# Patient Record
Sex: Female | Born: 1935 | Race: White | Hispanic: No | Marital: Married | Smoking: Former smoker
Health system: Southern US, Community
[De-identification: ages and names within clinical notes are randomized; demographics above are authoritative.]

## PROBLEM LIST (undated history)

## (undated) DIAGNOSIS — R519 Headache, unspecified: Secondary | ICD-10-CM

## (undated) DIAGNOSIS — G709 Myoneural disorder, unspecified: Secondary | ICD-10-CM

## (undated) DIAGNOSIS — E785 Hyperlipidemia, unspecified: Secondary | ICD-10-CM

## (undated) DIAGNOSIS — E039 Hypothyroidism, unspecified: Secondary | ICD-10-CM

## (undated) DIAGNOSIS — N189 Chronic kidney disease, unspecified: Secondary | ICD-10-CM

## (undated) DIAGNOSIS — I10 Essential (primary) hypertension: Secondary | ICD-10-CM

## (undated) DIAGNOSIS — J449 Chronic obstructive pulmonary disease, unspecified: Secondary | ICD-10-CM

## (undated) DIAGNOSIS — R319 Hematuria, unspecified: Secondary | ICD-10-CM

## (undated) DIAGNOSIS — C801 Malignant (primary) neoplasm, unspecified: Secondary | ICD-10-CM

## (undated) DIAGNOSIS — I639 Cerebral infarction, unspecified: Secondary | ICD-10-CM

## (undated) DIAGNOSIS — R51 Headache: Secondary | ICD-10-CM

## (undated) HISTORY — PX: MASTECTOMY: SHX3

## (undated) HISTORY — PX: DILATION AND CURETTAGE OF UTERUS: SHX78

## (undated) HISTORY — PX: CHOLECYSTECTOMY: SHX55

## (undated) HISTORY — PX: ABDOMINAL HYSTERECTOMY: SHX81

## (undated) HISTORY — PX: BREAST SURGERY: SHX581

## (undated) HISTORY — PX: TONSILLECTOMY: SUR1361

## (undated) HISTORY — PX: CYSTOSCOPY: SUR368

## (undated) HISTORY — PX: APPENDECTOMY: SHX54

---

## 1999-08-26 ENCOUNTER — Encounter: Payer: Self-pay | Admitting: Nephrology

## 1999-08-26 ENCOUNTER — Ambulatory Visit (HOSPITAL_COMMUNITY): Admission: RE | Admit: 1999-08-26 | Discharge: 1999-08-26 | Payer: Self-pay | Admitting: Nephrology

## 2014-04-28 DIAGNOSIS — J209 Acute bronchitis, unspecified: Secondary | ICD-10-CM | POA: Diagnosis not present

## 2014-04-28 DIAGNOSIS — Z6824 Body mass index (BMI) 24.0-24.9, adult: Secondary | ICD-10-CM | POA: Diagnosis not present

## 2014-06-19 DIAGNOSIS — M1990 Unspecified osteoarthritis, unspecified site: Secondary | ICD-10-CM | POA: Diagnosis not present

## 2014-06-19 DIAGNOSIS — E785 Hyperlipidemia, unspecified: Secondary | ICD-10-CM | POA: Diagnosis not present

## 2014-06-19 DIAGNOSIS — J449 Chronic obstructive pulmonary disease, unspecified: Secondary | ICD-10-CM | POA: Diagnosis not present

## 2014-06-19 DIAGNOSIS — I10 Essential (primary) hypertension: Secondary | ICD-10-CM | POA: Diagnosis not present

## 2014-06-19 DIAGNOSIS — N189 Chronic kidney disease, unspecified: Secondary | ICD-10-CM | POA: Diagnosis not present

## 2014-06-19 DIAGNOSIS — D72829 Elevated white blood cell count, unspecified: Secondary | ICD-10-CM | POA: Diagnosis not present

## 2014-06-19 DIAGNOSIS — R5383 Other fatigue: Secondary | ICD-10-CM | POA: Diagnosis not present

## 2014-06-19 DIAGNOSIS — E039 Hypothyroidism, unspecified: Secondary | ICD-10-CM | POA: Diagnosis not present

## 2014-09-23 DIAGNOSIS — D72829 Elevated white blood cell count, unspecified: Secondary | ICD-10-CM | POA: Diagnosis not present

## 2014-09-23 DIAGNOSIS — I6990 Unspecified sequelae of unspecified cerebrovascular disease: Secondary | ICD-10-CM | POA: Diagnosis not present

## 2014-09-23 DIAGNOSIS — N189 Chronic kidney disease, unspecified: Secondary | ICD-10-CM | POA: Diagnosis not present

## 2014-09-23 DIAGNOSIS — I10 Essential (primary) hypertension: Secondary | ICD-10-CM | POA: Diagnosis not present

## 2014-09-23 DIAGNOSIS — Z9181 History of falling: Secondary | ICD-10-CM | POA: Diagnosis not present

## 2014-09-23 DIAGNOSIS — E785 Hyperlipidemia, unspecified: Secondary | ICD-10-CM | POA: Diagnosis not present

## 2014-09-23 DIAGNOSIS — Z139 Encounter for screening, unspecified: Secondary | ICD-10-CM | POA: Diagnosis not present

## 2014-09-23 DIAGNOSIS — Z1389 Encounter for screening for other disorder: Secondary | ICD-10-CM | POA: Diagnosis not present

## 2014-09-23 DIAGNOSIS — M79609 Pain in unspecified limb: Secondary | ICD-10-CM | POA: Diagnosis not present

## 2014-09-29 DIAGNOSIS — M79604 Pain in right leg: Secondary | ICD-10-CM | POA: Diagnosis not present

## 2014-09-29 DIAGNOSIS — Z6826 Body mass index (BMI) 26.0-26.9, adult: Secondary | ICD-10-CM | POA: Diagnosis not present

## 2014-10-01 DIAGNOSIS — M79604 Pain in right leg: Secondary | ICD-10-CM | POA: Diagnosis not present

## 2014-10-01 DIAGNOSIS — M19071 Primary osteoarthritis, right ankle and foot: Secondary | ICD-10-CM | POA: Diagnosis not present

## 2014-10-01 DIAGNOSIS — M79672 Pain in left foot: Secondary | ICD-10-CM | POA: Diagnosis not present

## 2014-10-07 DIAGNOSIS — Z6826 Body mass index (BMI) 26.0-26.9, adult: Secondary | ICD-10-CM | POA: Diagnosis not present

## 2014-10-07 DIAGNOSIS — M79609 Pain in unspecified limb: Secondary | ICD-10-CM | POA: Diagnosis not present

## 2014-10-07 DIAGNOSIS — M1990 Unspecified osteoarthritis, unspecified site: Secondary | ICD-10-CM | POA: Diagnosis not present

## 2014-11-07 DIAGNOSIS — H3531 Nonexudative age-related macular degeneration: Secondary | ICD-10-CM | POA: Diagnosis not present

## 2014-11-07 DIAGNOSIS — H5203 Hypermetropia, bilateral: Secondary | ICD-10-CM | POA: Diagnosis not present

## 2014-11-09 DIAGNOSIS — Z7902 Long term (current) use of antithrombotics/antiplatelets: Secondary | ICD-10-CM | POA: Diagnosis not present

## 2014-11-09 DIAGNOSIS — I639 Cerebral infarction, unspecified: Secondary | ICD-10-CM | POA: Diagnosis not present

## 2014-11-09 DIAGNOSIS — I6350 Cerebral infarction due to unspecified occlusion or stenosis of unspecified cerebral artery: Secondary | ICD-10-CM | POA: Diagnosis not present

## 2014-11-09 DIAGNOSIS — I672 Cerebral atherosclerosis: Secondary | ICD-10-CM | POA: Diagnosis not present

## 2014-11-09 DIAGNOSIS — N189 Chronic kidney disease, unspecified: Secondary | ICD-10-CM | POA: Diagnosis not present

## 2014-11-09 DIAGNOSIS — I671 Cerebral aneurysm, nonruptured: Secondary | ICD-10-CM | POA: Diagnosis not present

## 2014-11-09 DIAGNOSIS — I779 Disorder of arteries and arterioles, unspecified: Secondary | ICD-10-CM | POA: Diagnosis not present

## 2014-11-09 DIAGNOSIS — R079 Chest pain, unspecified: Secondary | ICD-10-CM | POA: Diagnosis not present

## 2014-11-09 DIAGNOSIS — M109 Gout, unspecified: Secondary | ICD-10-CM | POA: Diagnosis not present

## 2014-11-09 DIAGNOSIS — D631 Anemia in chronic kidney disease: Secondary | ICD-10-CM | POA: Diagnosis not present

## 2014-11-09 DIAGNOSIS — I129 Hypertensive chronic kidney disease with stage 1 through stage 4 chronic kidney disease, or unspecified chronic kidney disease: Secondary | ICD-10-CM | POA: Diagnosis not present

## 2014-11-09 DIAGNOSIS — R29810 Facial weakness: Secondary | ICD-10-CM | POA: Diagnosis not present

## 2014-11-09 DIAGNOSIS — I6523 Occlusion and stenosis of bilateral carotid arteries: Secondary | ICD-10-CM | POA: Diagnosis not present

## 2014-11-09 DIAGNOSIS — N183 Chronic kidney disease, stage 3 (moderate): Secondary | ICD-10-CM | POA: Diagnosis not present

## 2014-11-09 DIAGNOSIS — Z720 Tobacco use: Secondary | ICD-10-CM | POA: Diagnosis not present

## 2014-11-09 DIAGNOSIS — J439 Emphysema, unspecified: Secondary | ICD-10-CM | POA: Diagnosis not present

## 2014-11-09 DIAGNOSIS — J432 Centrilobular emphysema: Secondary | ICD-10-CM | POA: Diagnosis not present

## 2014-11-09 DIAGNOSIS — F329 Major depressive disorder, single episode, unspecified: Secondary | ICD-10-CM | POA: Diagnosis not present

## 2014-11-09 DIAGNOSIS — Z9049 Acquired absence of other specified parts of digestive tract: Secondary | ICD-10-CM | POA: Diagnosis not present

## 2014-11-09 DIAGNOSIS — M1990 Unspecified osteoarthritis, unspecified site: Secondary | ICD-10-CM | POA: Diagnosis not present

## 2014-11-09 DIAGNOSIS — D649 Anemia, unspecified: Secondary | ICD-10-CM | POA: Diagnosis not present

## 2014-11-09 DIAGNOSIS — Z8673 Personal history of transient ischemic attack (TIA), and cerebral infarction without residual deficits: Secondary | ICD-10-CM | POA: Diagnosis not present

## 2014-11-09 DIAGNOSIS — E039 Hypothyroidism, unspecified: Secondary | ICD-10-CM | POA: Diagnosis not present

## 2014-11-09 DIAGNOSIS — E780 Pure hypercholesterolemia: Secondary | ICD-10-CM | POA: Diagnosis not present

## 2014-11-09 DIAGNOSIS — I361 Nonrheumatic tricuspid (valve) insufficiency: Secondary | ICD-10-CM | POA: Diagnosis not present

## 2014-11-09 DIAGNOSIS — Z8249 Family history of ischemic heart disease and other diseases of the circulatory system: Secondary | ICD-10-CM | POA: Diagnosis not present

## 2014-11-13 ENCOUNTER — Telehealth (HOSPITAL_COMMUNITY): Payer: Self-pay | Admitting: Interventional Radiology

## 2014-11-13 NOTE — Telephone Encounter (Signed)
Have made multiple attempts to contact pt's granddaughter. She did not answer and has no VM set up [phone]). I called and spoke to the patient and she said she would have her granddaughter return my call. JM

## 2014-11-14 ENCOUNTER — Other Ambulatory Visit (HOSPITAL_COMMUNITY): Payer: Self-pay | Admitting: Interventional Radiology

## 2014-11-14 DIAGNOSIS — D649 Anemia, unspecified: Secondary | ICD-10-CM | POA: Diagnosis not present

## 2014-11-14 DIAGNOSIS — I671 Cerebral aneurysm, nonruptured: Secondary | ICD-10-CM | POA: Diagnosis not present

## 2014-11-14 DIAGNOSIS — Z720 Tobacco use: Secondary | ICD-10-CM | POA: Diagnosis not present

## 2014-11-14 DIAGNOSIS — I6350 Cerebral infarction due to unspecified occlusion or stenosis of unspecified cerebral artery: Secondary | ICD-10-CM | POA: Diagnosis not present

## 2014-11-14 DIAGNOSIS — I779 Disorder of arteries and arterioles, unspecified: Secondary | ICD-10-CM | POA: Diagnosis not present

## 2014-11-14 DIAGNOSIS — E780 Pure hypercholesterolemia: Secondary | ICD-10-CM | POA: Diagnosis not present

## 2014-11-16 DIAGNOSIS — I6350 Cerebral infarction due to unspecified occlusion or stenosis of unspecified cerebral artery: Secondary | ICD-10-CM | POA: Diagnosis not present

## 2014-11-16 DIAGNOSIS — E780 Pure hypercholesterolemia: Secondary | ICD-10-CM | POA: Diagnosis not present

## 2014-11-16 DIAGNOSIS — Z720 Tobacco use: Secondary | ICD-10-CM | POA: Diagnosis not present

## 2014-11-16 DIAGNOSIS — I779 Disorder of arteries and arterioles, unspecified: Secondary | ICD-10-CM | POA: Diagnosis not present

## 2014-11-16 DIAGNOSIS — I671 Cerebral aneurysm, nonruptured: Secondary | ICD-10-CM | POA: Diagnosis not present

## 2014-11-16 DIAGNOSIS — D649 Anemia, unspecified: Secondary | ICD-10-CM | POA: Diagnosis not present

## 2014-11-18 DIAGNOSIS — Z720 Tobacco use: Secondary | ICD-10-CM | POA: Diagnosis not present

## 2014-11-18 DIAGNOSIS — I6350 Cerebral infarction due to unspecified occlusion or stenosis of unspecified cerebral artery: Secondary | ICD-10-CM | POA: Diagnosis not present

## 2014-11-18 DIAGNOSIS — E780 Pure hypercholesterolemia: Secondary | ICD-10-CM | POA: Diagnosis not present

## 2014-11-18 DIAGNOSIS — I671 Cerebral aneurysm, nonruptured: Secondary | ICD-10-CM | POA: Diagnosis not present

## 2014-11-18 DIAGNOSIS — J449 Chronic obstructive pulmonary disease, unspecified: Secondary | ICD-10-CM | POA: Diagnosis not present

## 2014-11-18 DIAGNOSIS — D649 Anemia, unspecified: Secondary | ICD-10-CM | POA: Diagnosis not present

## 2014-11-18 DIAGNOSIS — Z6826 Body mass index (BMI) 26.0-26.9, adult: Secondary | ICD-10-CM | POA: Diagnosis not present

## 2014-11-18 DIAGNOSIS — I639 Cerebral infarction, unspecified: Secondary | ICD-10-CM | POA: Diagnosis not present

## 2014-11-18 DIAGNOSIS — I10 Essential (primary) hypertension: Secondary | ICD-10-CM | POA: Diagnosis not present

## 2014-11-18 DIAGNOSIS — I779 Disorder of arteries and arterioles, unspecified: Secondary | ICD-10-CM | POA: Diagnosis not present

## 2014-11-19 DIAGNOSIS — Z720 Tobacco use: Secondary | ICD-10-CM | POA: Diagnosis not present

## 2014-11-19 DIAGNOSIS — E780 Pure hypercholesterolemia: Secondary | ICD-10-CM | POA: Diagnosis not present

## 2014-11-19 DIAGNOSIS — I779 Disorder of arteries and arterioles, unspecified: Secondary | ICD-10-CM | POA: Diagnosis not present

## 2014-11-19 DIAGNOSIS — I671 Cerebral aneurysm, nonruptured: Secondary | ICD-10-CM | POA: Diagnosis not present

## 2014-11-19 DIAGNOSIS — I6350 Cerebral infarction due to unspecified occlusion or stenosis of unspecified cerebral artery: Secondary | ICD-10-CM | POA: Diagnosis not present

## 2014-11-19 DIAGNOSIS — D649 Anemia, unspecified: Secondary | ICD-10-CM | POA: Diagnosis not present

## 2014-11-20 ENCOUNTER — Ambulatory Visit (HOSPITAL_COMMUNITY)
Admission: RE | Admit: 2014-11-20 | Discharge: 2014-11-20 | Disposition: A | Discharge status: Home / Self Care | Payer: Medicare Other | Source: Ambulatory Visit | Attending: Interventional Radiology | Admitting: Interventional Radiology

## 2014-11-20 DIAGNOSIS — I671 Cerebral aneurysm, nonruptured: Secondary | ICD-10-CM

## 2014-11-21 DIAGNOSIS — E780 Pure hypercholesterolemia: Secondary | ICD-10-CM | POA: Diagnosis not present

## 2014-11-21 DIAGNOSIS — I6350 Cerebral infarction due to unspecified occlusion or stenosis of unspecified cerebral artery: Secondary | ICD-10-CM | POA: Diagnosis not present

## 2014-11-21 DIAGNOSIS — D649 Anemia, unspecified: Secondary | ICD-10-CM | POA: Diagnosis not present

## 2014-11-21 DIAGNOSIS — Z720 Tobacco use: Secondary | ICD-10-CM | POA: Diagnosis not present

## 2014-11-21 DIAGNOSIS — I671 Cerebral aneurysm, nonruptured: Secondary | ICD-10-CM | POA: Diagnosis not present

## 2014-11-21 DIAGNOSIS — I779 Disorder of arteries and arterioles, unspecified: Secondary | ICD-10-CM | POA: Diagnosis not present

## 2014-11-24 DIAGNOSIS — Z720 Tobacco use: Secondary | ICD-10-CM | POA: Diagnosis not present

## 2014-11-24 DIAGNOSIS — I671 Cerebral aneurysm, nonruptured: Secondary | ICD-10-CM | POA: Diagnosis not present

## 2014-11-24 DIAGNOSIS — I6350 Cerebral infarction due to unspecified occlusion or stenosis of unspecified cerebral artery: Secondary | ICD-10-CM | POA: Diagnosis not present

## 2014-11-24 DIAGNOSIS — D649 Anemia, unspecified: Secondary | ICD-10-CM | POA: Diagnosis not present

## 2014-11-24 DIAGNOSIS — E780 Pure hypercholesterolemia: Secondary | ICD-10-CM | POA: Diagnosis not present

## 2014-11-24 DIAGNOSIS — I779 Disorder of arteries and arterioles, unspecified: Secondary | ICD-10-CM | POA: Diagnosis not present

## 2014-11-25 DIAGNOSIS — I6350 Cerebral infarction due to unspecified occlusion or stenosis of unspecified cerebral artery: Secondary | ICD-10-CM | POA: Diagnosis not present

## 2014-11-25 DIAGNOSIS — I671 Cerebral aneurysm, nonruptured: Secondary | ICD-10-CM | POA: Diagnosis not present

## 2014-11-25 DIAGNOSIS — D649 Anemia, unspecified: Secondary | ICD-10-CM | POA: Diagnosis not present

## 2014-11-25 DIAGNOSIS — E780 Pure hypercholesterolemia: Secondary | ICD-10-CM | POA: Diagnosis not present

## 2014-11-25 DIAGNOSIS — Z720 Tobacco use: Secondary | ICD-10-CM | POA: Diagnosis not present

## 2014-11-25 DIAGNOSIS — I779 Disorder of arteries and arterioles, unspecified: Secondary | ICD-10-CM | POA: Diagnosis not present

## 2014-11-27 ENCOUNTER — Telehealth (HOSPITAL_COMMUNITY): Payer: Self-pay

## 2014-11-27 DIAGNOSIS — I6350 Cerebral infarction due to unspecified occlusion or stenosis of unspecified cerebral artery: Secondary | ICD-10-CM | POA: Diagnosis not present

## 2014-11-27 DIAGNOSIS — I671 Cerebral aneurysm, nonruptured: Secondary | ICD-10-CM | POA: Diagnosis not present

## 2014-11-27 DIAGNOSIS — I779 Disorder of arteries and arterioles, unspecified: Secondary | ICD-10-CM | POA: Diagnosis not present

## 2014-11-27 DIAGNOSIS — E780 Pure hypercholesterolemia: Secondary | ICD-10-CM | POA: Diagnosis not present

## 2014-11-27 DIAGNOSIS — Z720 Tobacco use: Secondary | ICD-10-CM | POA: Diagnosis not present

## 2014-11-27 DIAGNOSIS — D649 Anemia, unspecified: Secondary | ICD-10-CM | POA: Diagnosis not present

## 2014-11-28 ENCOUNTER — Telehealth (HOSPITAL_COMMUNITY): Payer: Self-pay | Admitting: Interventional Radiology

## 2014-11-28 DIAGNOSIS — I6350 Cerebral infarction due to unspecified occlusion or stenosis of unspecified cerebral artery: Secondary | ICD-10-CM | POA: Diagnosis not present

## 2014-11-28 DIAGNOSIS — I671 Cerebral aneurysm, nonruptured: Secondary | ICD-10-CM | POA: Diagnosis not present

## 2014-11-28 DIAGNOSIS — I779 Disorder of arteries and arterioles, unspecified: Secondary | ICD-10-CM | POA: Diagnosis not present

## 2014-11-28 DIAGNOSIS — E780 Pure hypercholesterolemia: Secondary | ICD-10-CM | POA: Diagnosis not present

## 2014-11-28 DIAGNOSIS — Z720 Tobacco use: Secondary | ICD-10-CM | POA: Diagnosis not present

## 2014-11-28 DIAGNOSIS — D649 Anemia, unspecified: Secondary | ICD-10-CM | POA: Diagnosis not present

## 2014-11-28 NOTE — Telephone Encounter (Signed)
Have tried to call the patient's granddaughter multiple times. She has no VM set up so unable to leave a message. I called the patient today and asked her to have her granddaughter call me. She stated that she would call her and have her call me. JM

## 2014-12-02 DIAGNOSIS — I10 Essential (primary) hypertension: Secondary | ICD-10-CM | POA: Diagnosis not present

## 2014-12-02 DIAGNOSIS — E780 Pure hypercholesterolemia: Secondary | ICD-10-CM | POA: Diagnosis not present

## 2014-12-02 DIAGNOSIS — Z01810 Encounter for preprocedural cardiovascular examination: Secondary | ICD-10-CM | POA: Diagnosis not present

## 2014-12-03 DIAGNOSIS — I779 Disorder of arteries and arterioles, unspecified: Secondary | ICD-10-CM | POA: Diagnosis not present

## 2014-12-03 DIAGNOSIS — Z720 Tobacco use: Secondary | ICD-10-CM | POA: Diagnosis not present

## 2014-12-03 DIAGNOSIS — E780 Pure hypercholesterolemia: Secondary | ICD-10-CM | POA: Diagnosis not present

## 2014-12-03 DIAGNOSIS — I6350 Cerebral infarction due to unspecified occlusion or stenosis of unspecified cerebral artery: Secondary | ICD-10-CM | POA: Diagnosis not present

## 2014-12-03 DIAGNOSIS — D649 Anemia, unspecified: Secondary | ICD-10-CM | POA: Diagnosis not present

## 2014-12-03 DIAGNOSIS — I671 Cerebral aneurysm, nonruptured: Secondary | ICD-10-CM | POA: Diagnosis not present

## 2014-12-04 DIAGNOSIS — Z720 Tobacco use: Secondary | ICD-10-CM | POA: Diagnosis not present

## 2014-12-04 DIAGNOSIS — I6350 Cerebral infarction due to unspecified occlusion or stenosis of unspecified cerebral artery: Secondary | ICD-10-CM | POA: Diagnosis not present

## 2014-12-04 DIAGNOSIS — I671 Cerebral aneurysm, nonruptured: Secondary | ICD-10-CM | POA: Diagnosis not present

## 2014-12-04 DIAGNOSIS — D649 Anemia, unspecified: Secondary | ICD-10-CM | POA: Diagnosis not present

## 2014-12-04 DIAGNOSIS — I779 Disorder of arteries and arterioles, unspecified: Secondary | ICD-10-CM | POA: Diagnosis not present

## 2014-12-04 DIAGNOSIS — E780 Pure hypercholesterolemia: Secondary | ICD-10-CM | POA: Diagnosis not present

## 2014-12-05 DIAGNOSIS — Z01810 Encounter for preprocedural cardiovascular examination: Secondary | ICD-10-CM | POA: Diagnosis not present

## 2014-12-08 DIAGNOSIS — I6350 Cerebral infarction due to unspecified occlusion or stenosis of unspecified cerebral artery: Secondary | ICD-10-CM | POA: Diagnosis not present

## 2014-12-08 DIAGNOSIS — D649 Anemia, unspecified: Secondary | ICD-10-CM | POA: Diagnosis not present

## 2014-12-08 DIAGNOSIS — I671 Cerebral aneurysm, nonruptured: Secondary | ICD-10-CM | POA: Diagnosis not present

## 2014-12-08 DIAGNOSIS — I779 Disorder of arteries and arterioles, unspecified: Secondary | ICD-10-CM | POA: Diagnosis not present

## 2014-12-08 DIAGNOSIS — E780 Pure hypercholesterolemia: Secondary | ICD-10-CM | POA: Diagnosis not present

## 2014-12-08 DIAGNOSIS — Z720 Tobacco use: Secondary | ICD-10-CM | POA: Diagnosis not present

## 2014-12-09 DIAGNOSIS — R9439 Abnormal result of other cardiovascular function study: Secondary | ICD-10-CM | POA: Diagnosis not present

## 2014-12-09 DIAGNOSIS — Z01810 Encounter for preprocedural cardiovascular examination: Secondary | ICD-10-CM | POA: Diagnosis not present

## 2014-12-12 DIAGNOSIS — Z720 Tobacco use: Secondary | ICD-10-CM | POA: Diagnosis not present

## 2014-12-12 DIAGNOSIS — I6350 Cerebral infarction due to unspecified occlusion or stenosis of unspecified cerebral artery: Secondary | ICD-10-CM | POA: Diagnosis not present

## 2014-12-12 DIAGNOSIS — D649 Anemia, unspecified: Secondary | ICD-10-CM | POA: Diagnosis not present

## 2014-12-12 DIAGNOSIS — I671 Cerebral aneurysm, nonruptured: Secondary | ICD-10-CM | POA: Diagnosis not present

## 2014-12-12 DIAGNOSIS — I779 Disorder of arteries and arterioles, unspecified: Secondary | ICD-10-CM | POA: Diagnosis not present

## 2014-12-12 DIAGNOSIS — E780 Pure hypercholesterolemia: Secondary | ICD-10-CM | POA: Diagnosis not present

## 2014-12-16 ENCOUNTER — Telehealth (HOSPITAL_COMMUNITY): Payer: Self-pay

## 2014-12-16 DIAGNOSIS — I639 Cerebral infarction, unspecified: Secondary | ICD-10-CM | POA: Diagnosis not present

## 2014-12-16 DIAGNOSIS — N189 Chronic kidney disease, unspecified: Secondary | ICD-10-CM | POA: Diagnosis not present

## 2014-12-16 DIAGNOSIS — E039 Hypothyroidism, unspecified: Secondary | ICD-10-CM | POA: Diagnosis not present

## 2014-12-16 DIAGNOSIS — I671 Cerebral aneurysm, nonruptured: Secondary | ICD-10-CM | POA: Diagnosis not present

## 2014-12-16 DIAGNOSIS — Z6826 Body mass index (BMI) 26.0-26.9, adult: Secondary | ICD-10-CM | POA: Diagnosis not present

## 2014-12-16 DIAGNOSIS — Z720 Tobacco use: Secondary | ICD-10-CM | POA: Diagnosis not present

## 2014-12-16 NOTE — Telephone Encounter (Signed)
Called daughter Jackalyn Lombard to let her know that we were waiting on insurance info to get back before we could schedule procedure. Daughter was ok with this plan and I told her that I would let her know as soon as I get pre-cert back. AW

## 2014-12-17 ENCOUNTER — Other Ambulatory Visit (HOSPITAL_COMMUNITY): Payer: Self-pay | Admitting: Interventional Radiology

## 2014-12-17 DIAGNOSIS — I779 Disorder of arteries and arterioles, unspecified: Secondary | ICD-10-CM | POA: Diagnosis not present

## 2014-12-17 DIAGNOSIS — E780 Pure hypercholesterolemia: Secondary | ICD-10-CM | POA: Diagnosis not present

## 2014-12-17 DIAGNOSIS — I729 Aneurysm of unspecified site: Secondary | ICD-10-CM

## 2014-12-17 DIAGNOSIS — I6350 Cerebral infarction due to unspecified occlusion or stenosis of unspecified cerebral artery: Secondary | ICD-10-CM | POA: Diagnosis not present

## 2014-12-17 DIAGNOSIS — I671 Cerebral aneurysm, nonruptured: Secondary | ICD-10-CM | POA: Diagnosis not present

## 2014-12-17 DIAGNOSIS — D649 Anemia, unspecified: Secondary | ICD-10-CM | POA: Diagnosis not present

## 2014-12-17 DIAGNOSIS — Z720 Tobacco use: Secondary | ICD-10-CM | POA: Diagnosis not present

## 2014-12-18 ENCOUNTER — Other Ambulatory Visit: Payer: Self-pay | Admitting: Radiology

## 2014-12-30 ENCOUNTER — Other Ambulatory Visit: Payer: Self-pay | Admitting: Radiology

## 2014-12-30 ENCOUNTER — Encounter (HOSPITAL_COMMUNITY): Payer: Self-pay | Admitting: Vascular Surgery

## 2014-12-30 ENCOUNTER — Encounter (HOSPITAL_COMMUNITY): Payer: Self-pay

## 2014-12-30 ENCOUNTER — Encounter (HOSPITAL_COMMUNITY)
Admission: RE | Admit: 2014-12-30 | Discharge: 2014-12-30 | Disposition: A | Discharge status: Home / Self Care | Payer: Medicare Other | Source: Ambulatory Visit | Attending: Interventional Radiology | Admitting: Interventional Radiology

## 2014-12-30 DIAGNOSIS — I671 Cerebral aneurysm, nonruptured: Secondary | ICD-10-CM | POA: Insufficient documentation

## 2014-12-30 DIAGNOSIS — Z01818 Encounter for other preprocedural examination: Secondary | ICD-10-CM | POA: Diagnosis not present

## 2014-12-30 DIAGNOSIS — Z8673 Personal history of transient ischemic attack (TIA), and cerebral infarction without residual deficits: Secondary | ICD-10-CM | POA: Diagnosis not present

## 2014-12-30 DIAGNOSIS — E785 Hyperlipidemia, unspecified: Secondary | ICD-10-CM | POA: Diagnosis not present

## 2014-12-30 DIAGNOSIS — Z853 Personal history of malignant neoplasm of breast: Secondary | ICD-10-CM | POA: Diagnosis not present

## 2014-12-30 DIAGNOSIS — I129 Hypertensive chronic kidney disease with stage 1 through stage 4 chronic kidney disease, or unspecified chronic kidney disease: Secondary | ICD-10-CM | POA: Insufficient documentation

## 2014-12-30 DIAGNOSIS — E039 Hypothyroidism, unspecified: Secondary | ICD-10-CM | POA: Insufficient documentation

## 2014-12-30 DIAGNOSIS — Z79899 Other long term (current) drug therapy: Secondary | ICD-10-CM | POA: Insufficient documentation

## 2014-12-30 DIAGNOSIS — Z87891 Personal history of nicotine dependence: Secondary | ICD-10-CM | POA: Insufficient documentation

## 2014-12-30 DIAGNOSIS — N189 Chronic kidney disease, unspecified: Secondary | ICD-10-CM | POA: Diagnosis not present

## 2014-12-30 DIAGNOSIS — Z01812 Encounter for preprocedural laboratory examination: Secondary | ICD-10-CM | POA: Diagnosis not present

## 2014-12-30 DIAGNOSIS — Z7982 Long term (current) use of aspirin: Secondary | ICD-10-CM | POA: Diagnosis not present

## 2014-12-30 HISTORY — DX: Chronic kidney disease, unspecified: N18.9

## 2014-12-30 HISTORY — DX: Headache: R51

## 2014-12-30 HISTORY — DX: Myoneural disorder, unspecified: G70.9

## 2014-12-30 HISTORY — DX: Chronic obstructive pulmonary disease, unspecified: J44.9

## 2014-12-30 HISTORY — DX: Hyperlipidemia, unspecified: E78.5

## 2014-12-30 HISTORY — DX: Essential (primary) hypertension: I10

## 2014-12-30 HISTORY — DX: Malignant (primary) neoplasm, unspecified: C80.1

## 2014-12-30 HISTORY — DX: Hematuria, unspecified: R31.9

## 2014-12-30 HISTORY — DX: Headache, unspecified: R51.9

## 2014-12-30 HISTORY — DX: Cerebral infarction, unspecified: I63.9

## 2014-12-30 HISTORY — DX: Hypothyroidism, unspecified: E03.9

## 2014-12-30 LAB — APTT: APTT: 28 s (ref 24–37)

## 2014-12-30 LAB — COMPREHENSIVE METABOLIC PANEL
ALBUMIN: 3.3 g/dL — AB (ref 3.5–5.0)
ALT: 14 U/L (ref 14–54)
ANION GAP: 6 (ref 5–15)
AST: 20 U/L (ref 15–41)
Alkaline Phosphatase: 93 U/L (ref 38–126)
BILIRUBIN TOTAL: 0.4 mg/dL (ref 0.3–1.2)
BUN: 43 mg/dL — AB (ref 6–20)
CHLORIDE: 107 mmol/L (ref 101–111)
CO2: 27 mmol/L (ref 22–32)
Calcium: 9.1 mg/dL (ref 8.9–10.3)
Creatinine, Ser: 2.73 mg/dL — ABNORMAL HIGH (ref 0.44–1.00)
GFR calc Af Amer: 17 mL/min — ABNORMAL LOW (ref >60)
GFR calc non Af Amer: 15 mL/min — ABNORMAL LOW (ref >60)
GLUCOSE: 97 mg/dL (ref 65–99)
POTASSIUM: 4.9 mmol/L (ref 3.5–5.1)
SODIUM: 140 mmol/L (ref 135–145)
TOTAL PROTEIN: 6.9 g/dL (ref 6.5–8.1)

## 2014-12-30 LAB — CBC WITH DIFFERENTIAL/PLATELET
BASOS ABS: 0 10*3/uL (ref 0.0–0.1)
BASOS PCT: 0 % (ref 0–1)
EOS ABS: 0.3 10*3/uL (ref 0.0–0.7)
EOS PCT: 3 % (ref 0–5)
HEMATOCRIT: 33.5 % — AB (ref 36.0–46.0)
Hemoglobin: 10.5 g/dL — ABNORMAL LOW (ref 12.0–15.0)
Lymphocytes Relative: 22 % (ref 12–46)
Lymphs Abs: 2.4 10*3/uL (ref 0.7–4.0)
MCH: 30.7 pg (ref 26.0–34.0)
MCHC: 31.3 g/dL (ref 30.0–36.0)
MCV: 98 fL (ref 78.0–100.0)
MONO ABS: 0.6 10*3/uL (ref 0.1–1.0)
MONOS PCT: 6 % (ref 3–12)
NEUTROS ABS: 7.6 10*3/uL (ref 1.7–7.7)
Neutrophils Relative %: 69 % (ref 43–77)
PLATELETS: 210 10*3/uL (ref 150–400)
RBC: 3.42 MIL/uL — ABNORMAL LOW (ref 3.87–5.11)
RDW: 16.3 % — AB (ref 11.5–15.5)
WBC: 11 10*3/uL — ABNORMAL HIGH (ref 4.0–10.5)

## 2014-12-30 LAB — PROTIME-INR
INR: 1.07 (ref 0.00–1.49)
Prothrombin Time: 14.1 seconds (ref 11.6–15.2)

## 2014-12-30 NOTE — Pre-Procedure Instructions (Addendum)
Danielle Erickson  12/30/2014      [facility] - [place], [place] [address] Phone: [phone] Fax: [phone]    Your procedure is scheduled on Thursday September 8th 2016 @ 830.  Report to [facility] Admitting at 630 A.M.  Call this number if you have problems the morning of surgery:  [phone]   Call this number if you have problems in the days leading up to your surgery:  [phone]    Remember:  Do not eat food or drink liquids after midnight Thursday September 7th 2016.  Take these medicines the morning of surgery with A SIP OF WATER allopurinol (zyloprim), amlodipine (norvasc), Clonidine (Catapres),nite, Clonazepam (Klonopin), metoprolol (Toprol-XL), Paroxetine (Paxil),nite, levothyroxine (synthroid, levothroid) IF needed: Hydrocodone-acetaminophen (Vicodin)   Take medicines as you normally do, but these are the ones you can take in the morning if you normally take them then.  If you normally take your metoprolol in the morning, please be sure to take that then.     STOP: ALL Vitamins, Supplements, Effient and Herbal Medications, Fish Oils, Aspirins, NSAIDs (Nonsteroidal Anti-inflammatories such as  Ibuprofen, Aleve, or Advil), and Goody's/BC Powders 7 days prior to surgery, until after surgery as directed by your physician. This includes: Folic Acid/Vit X9/BZJ I96  Take aspirin 325 mg including dos STOP aggranox 12/30/14 per p turpin pa/ dr deveshwar( added 12/31/14 after talking to patient's dtr by Onelia Cadmus)   Do not wear jewelry, make-up or nail polish.  Do not wear lotions, powders, or perfumes.  You may wear deodorant.  Do not shave 48 hours prior to surgery.  Men may shave face and neck.  Do not bring valuables to the hospital.  [facility] is not responsible for any belongings or valuables.  Contacts, dentures or bridgework may not be worn into surgery.  Leave your suitcase in the car.  After  surgery it may be brought to your room.  For patients admitted to the hospital, discharge time will be determined by your treatment team.  Patients discharged the day of surgery will not be allowed to drive home.   Special instructions: Please follow these instructions carefully:  1. Shower with CHG Soap the night before surgery and the morning of Surgery. 2. If you choose to wash your hair, wash your hair first as usual with your normal shampoo. 3. After you shampoo, rinse your hair and body thoroughly to remove the Shampoo. 4. Use CHG as you would any other liquid soap. You can apply chg directly to the skin and wash gently with scrungie or a clean washcloth. 5. Apply the CHG Soap to your body ONLY FROM THE NECK DOWN. Do not use on open wounds or open sores. Avoid contact with your eyes, ears, mouth and genitals (private parts). Wash genitals (private parts) with your normal soap. 6. Wash thoroughly, paying special attention to the area where your surgery will be performed. 7. Thoroughly rinse your body with warm water from the neck down. 8. DO NOT shower/wash with your normal soap after using and rinsing off the CHG Soap. 9. Pat yourself dry with a clean towel.  10. Wear clean pajamas.  11. Place clean sheets on your bed the night of your first shower and do not sleep with pets.  Day of Surgery  Do not apply any lotions/deodorants the morning of surgery. Please wear clean clothes to the hospital/surgery center.    Please  read over the following fact sheets that you were given. Pain Booklet, Coughing and Deep Breathing and Surgical Site Infection Prevention

## 2014-12-30 NOTE — Progress Notes (Signed)
Checked with Jannifer Franklin, PA and originally told to tell patient to continue taking Aggrenox as normal and also morning of surgery.  Pam called back after patient had left and stated that MD wants pt to stop the Aggrenox and start a 325 mg aspirin daily to take through the DOS.  Called patient's son and verbalized instructions; he verbalized understanding.

## 2014-12-30 NOTE — Progress Notes (Addendum)
Anesthesia Chart Review: Patient is a 81 year old female scheduled for cerebral arteriogram with possible embolization of left MCA aneurysm under anesthesia on 01/01/15 by Dr. Estanislado Pandy. She had an episode of right sided extremity weakness in 12/2013 which recovered almost completely then ~ 10/2014 she had a similar episode, however this time involving primarily the right side of her face and mild expressive difficulties. MRI/MRA was done for CVA work-up and revealed a 10.3 mm aneurysm in the left MCA region. There was also suggestion of possible proximal right middle cerebral artery stenosis suggested by the decrease in caliber of the left MCA. However, similar caliber is also seen in the right MCA distribution at the level of the bifurcation. She was referred to IR for intervention. Dr. Estanislado Pandy felt her risk for rupture was 1-2% per year with attendant significant mortality and morbidity.   History includes recent former smoker (quit 7/1/6), HLD, HTN, CVA 12/2013 and 10/2014, hypothyroidism, CKD, neck tremor, breast cancer s/p right mastectomy ~ 20 years ago, hysterectomy, appendectomy, cholecystectomy. PCP is Laverna Peace, [facility] with [employer] in [place].   PAT Vitals: BP 165/60, RR 18, HR 70, 94% RA, 36.6C.  Meds include allopurinol, amlodipine, clonazepam, clonidine, Aggrenox, Lasix, Norco, levothyroxine, Toprol XL, Paxil. Crestor. Per PA Pam with IR, patient to stop Aggrenox and start 325 mg ASA daily and take on the day of surgery.   Patient was sent to [facility] [place] [facility] for a preoperative cardiology evaluation. She was seen by Neldon Labella, NP and ultimately cleared from a cardiac standpoint only following a stress and echo (see below). Testing revealed a small LV, so she recommended increased hydration pre and peri-operatively.  12/09/14 Echo [facility]): LV cavity is small. Mild concentric hypertrophy of the LV. Normal global wall motion. Doppler evidence of grade I (impaired) diastolic  dysfunction. Calculated EF 56%. Mild AV leaflet thickening, otherwise no siginficant valvular abnormalities.  12/05/14 Nuclear stress test [facility]):  1. ECG demonstrated NSR, normal resting conduction, no resting arrhythmias and normal rest repolarization. Stress EKG is non-diagnostic for ischemia as it a pharmacologic stress using Lesxiscan. Stress symptoms of dyspnea and dizziness.  2. Myocardial perfusion imaging is normal without ischemia or scar. LVEF was calculated at 17%, but probably an error due to small sized ventricle (45 mL). Overall low risk. Clinical correlation recommended.   12/02/14 EKG: NSR. T wave inversion in septal leads. Baseline wanderer, particularly in inferior leads.   Pre-procedure labs noted. Cr 2.73. Currently, I don't have any comparison labs but Dr. Arlean Hopping 11/20/14 consult note mentions known "mild renal insufficiency with a creatinine of 1.9..."  Records from [facility] requested, but are still pending. Patient denied having a neurologist. I updated anesthesiologist Dr. Glennon Mac regarding recent CVA history and increased creatinine. She agrees that renal issues will need to be addressed prior to surgery. I left a voice message with Anderson Malta at Dr. Arlean Hopping office and also notified PA Pam.  Chart will be left for follow-up of additional records.  George Hugh [facility] Short Stay Center/Anesthesiology Phone [phone] 12/30/2014 5:02 PM  Addendum: Records received from [facility]. She was hospitalized from 11/09/14-11/11/14 for non-hemorrhagic CVA. Attending was internist Dr. Delma Officer. She was not a candidate for tPA due to delayed presentation.   CKD was listed as stage III with Cr in 12/2013 of 1.8 and up to 2.3 on 11/11/14 (high of 2.5 during hospitalization).   She also had an echo on 11/09/14 that showed LVEF 60-65% with normal LV  size, impaired relaxation, mild TR.  11/10/14 Carotid duplex: No hemodynamically significant stenosis  or plaque is noted in either ICA's.   11/10/14 MRA Head/MRI Brain:  MRA: 8X10 mm aneurysm of the left MCA at the bifurcation/trifurcation region projecting anteriorly. Near occlusion in the left M1 segment, possibly due to embolus. Serial stenoses of the right M1, M2, and M3 segments.  MRI: Acute non-hemorrhagic infarct posterior left lenticular nucleus extending into the mid to posterior left corona radiata. Remote posterior left frontal-parietal lobe infarct with encephalomalacia. Remote mid left corona radiate infarct. Remote tiny cerebellar infarcts. Moderate small vessel disease type changes. 1cm left MCA bifurcation aneurysm.   11/09/14 1V CXR: Mild cardiac enlargement. No pleural effusion or edema. No airspace consolidation. The visualized osseous structures appear intact. Impression: No active disease.  George Hugh [facility] Short Stay Center/Anesthesiology Phone [phone] 12/30/2014 5:40 PM

## 2014-12-31 ENCOUNTER — Other Ambulatory Visit: Payer: Self-pay | Admitting: Radiology

## 2014-12-31 ENCOUNTER — Telehealth (HOSPITAL_COMMUNITY): Payer: Self-pay | Admitting: *Deleted

## 2014-12-31 NOTE — Telephone Encounter (Signed)
Called Danielle Erickson with lab work.  Explained to patient that due to her elevated BUN and CR Dr. Estanislado Pandy needed to cancel her procedure for 01/01/15.  Instructed patient that Dr. Estanislado Pandy wanted her to increase her water intake for next 3-4 days and that she need to come back in on Monday and have her labs redrawn.  Pt also instructed to remain off aggrenox and continue ASA 325mg  daily as instructed by Dr. Estanislado Pandy.  PT verbalized understanding

## 2015-01-01 ENCOUNTER — Ambulatory Visit (HOSPITAL_COMMUNITY)
Admission: RE | Admit: 2015-01-01 | Payer: Medicare Other | Source: Ambulatory Visit | Admitting: Interventional Radiology

## 2015-01-01 ENCOUNTER — Encounter (HOSPITAL_COMMUNITY): Admission: RE | Payer: Self-pay | Source: Ambulatory Visit

## 2015-01-01 ENCOUNTER — Inpatient Hospital Stay (HOSPITAL_COMMUNITY): Admission: RE | Admit: 2015-01-01 | Payer: Self-pay | Source: Ambulatory Visit

## 2015-01-01 SURGERY — RADIOLOGY WITH ANESTHESIA
Anesthesia: General

## 2015-01-05 ENCOUNTER — Other Ambulatory Visit: Payer: Self-pay | Admitting: Radiology

## 2015-01-05 ENCOUNTER — Telehealth (HOSPITAL_COMMUNITY): Payer: Self-pay | Admitting: Interventional Radiology

## 2015-01-05 LAB — BASIC METABOLIC PANEL
Anion gap: 10 (ref 5–15)
BUN: 40 mg/dL — AB (ref 6–20)
CHLORIDE: 105 mmol/L (ref 101–111)
CO2: 24 mmol/L (ref 22–32)
CREATININE: 2.59 mg/dL — AB (ref 0.44–1.00)
Calcium: 8.6 mg/dL — ABNORMAL LOW (ref 8.9–10.3)
GFR calc Af Amer: 19 mL/min — ABNORMAL LOW (ref >60)
GFR calc non Af Amer: 16 mL/min — ABNORMAL LOW (ref >60)
GLUCOSE: 100 mg/dL — AB (ref 65–99)
POTASSIUM: 4 mmol/L (ref 3.5–5.1)
SODIUM: 139 mmol/L (ref 135–145)

## 2015-01-05 NOTE — Telephone Encounter (Signed)
Pt and granddaughter Edwyna Ready) came in for pt to have a repeat B-MET study. After results were reviewed by Dr. Estanislado Pandy I called the patient's granddaughter back to let her know that per Deveshwar pt needs to continue hydration and have another recheck B-MET in 1 week's time. Ms. Luana Shu states understanding and is in agreement w/ this plan of care. JM

## 2015-01-12 ENCOUNTER — Other Ambulatory Visit: Payer: Self-pay | Admitting: Radiology

## 2015-01-12 ENCOUNTER — Other Ambulatory Visit: Payer: Self-pay | Admitting: Interventional Radiology

## 2015-01-12 DIAGNOSIS — I729 Aneurysm of unspecified site: Secondary | ICD-10-CM

## 2015-01-12 LAB — BASIC METABOLIC PANEL
Anion gap: 10 (ref 5–15)
BUN: 47 mg/dL — ABNORMAL HIGH (ref 6–20)
CALCIUM: 9.1 mg/dL (ref 8.9–10.3)
CHLORIDE: 108 mmol/L (ref 101–111)
CO2: 27 mmol/L (ref 22–32)
CREATININE: 2.79 mg/dL — AB (ref 0.44–1.00)
GFR calc Af Amer: 17 mL/min — ABNORMAL LOW (ref >60)
GFR calc non Af Amer: 15 mL/min — ABNORMAL LOW (ref >60)
GLUCOSE: 113 mg/dL — AB (ref 65–99)
Potassium: 4.8 mmol/L (ref 3.5–5.1)
Sodium: 145 mmol/L (ref 135–145)

## 2015-01-13 ENCOUNTER — Telehealth (HOSPITAL_COMMUNITY): Payer: Self-pay | Admitting: Interventional Radiology

## 2015-01-13 NOTE — Telephone Encounter (Signed)
Called and spoke to pt's granddaughter Danielle Erickson). Told her that Dr. Estanislado Pandy wants the patient to see a nephrologist due to her continued rising BUN and creatinine. Pt's granddaughter is going to call Dr. Elissa Hefty office to schedule a new patient appointment. She will call me back when this is arranged. JM

## 2015-01-27 DIAGNOSIS — J449 Chronic obstructive pulmonary disease, unspecified: Secondary | ICD-10-CM | POA: Diagnosis not present

## 2015-01-27 DIAGNOSIS — I639 Cerebral infarction, unspecified: Secondary | ICD-10-CM | POA: Diagnosis not present

## 2015-01-27 DIAGNOSIS — Z1389 Encounter for screening for other disorder: Secondary | ICD-10-CM | POA: Diagnosis not present

## 2015-01-27 DIAGNOSIS — E785 Hyperlipidemia, unspecified: Secondary | ICD-10-CM | POA: Diagnosis not present

## 2015-01-27 DIAGNOSIS — N189 Chronic kidney disease, unspecified: Secondary | ICD-10-CM | POA: Diagnosis not present

## 2015-01-27 DIAGNOSIS — Z6826 Body mass index (BMI) 26.0-26.9, adult: Secondary | ICD-10-CM | POA: Diagnosis not present

## 2015-01-27 DIAGNOSIS — Z23 Encounter for immunization: Secondary | ICD-10-CM | POA: Diagnosis not present

## 2015-02-04 DIAGNOSIS — S52502A Unspecified fracture of the lower end of left radius, initial encounter for closed fracture: Secondary | ICD-10-CM | POA: Diagnosis not present

## 2015-02-04 DIAGNOSIS — S52602A Unspecified fracture of lower end of left ulna, initial encounter for closed fracture: Secondary | ICD-10-CM | POA: Diagnosis not present

## 2015-02-04 DIAGNOSIS — Z9889 Other specified postprocedural states: Secondary | ICD-10-CM | POA: Diagnosis not present

## 2015-02-04 DIAGNOSIS — S52002A Unspecified fracture of upper end of left ulna, initial encounter for closed fracture: Secondary | ICD-10-CM | POA: Diagnosis not present

## 2015-02-04 DIAGNOSIS — S52102A Unspecified fracture of upper end of left radius, initial encounter for closed fracture: Secondary | ICD-10-CM | POA: Diagnosis not present

## 2015-02-06 DIAGNOSIS — S52002D Unspecified fracture of upper end of left ulna, subsequent encounter for closed fracture with routine healing: Secondary | ICD-10-CM | POA: Diagnosis not present

## 2015-02-06 DIAGNOSIS — G92 Toxic encephalopathy: Secondary | ICD-10-CM | POA: Diagnosis not present

## 2015-02-06 DIAGNOSIS — R488 Other symbolic dysfunctions: Secondary | ICD-10-CM | POA: Diagnosis not present

## 2015-02-06 DIAGNOSIS — I69354 Hemiplegia and hemiparesis following cerebral infarction affecting left non-dominant side: Secondary | ICD-10-CM | POA: Diagnosis not present

## 2015-02-06 DIAGNOSIS — N179 Acute kidney failure, unspecified: Secondary | ICD-10-CM | POA: Diagnosis not present

## 2015-02-06 DIAGNOSIS — F17210 Nicotine dependence, cigarettes, uncomplicated: Secondary | ICD-10-CM | POA: Diagnosis not present

## 2015-02-06 DIAGNOSIS — J439 Emphysema, unspecified: Secondary | ICD-10-CM | POA: Diagnosis not present

## 2015-02-06 DIAGNOSIS — R531 Weakness: Secondary | ICD-10-CM | POA: Diagnosis not present

## 2015-02-06 DIAGNOSIS — S52602A Unspecified fracture of lower end of left ulna, initial encounter for closed fracture: Secondary | ICD-10-CM | POA: Diagnosis not present

## 2015-02-06 DIAGNOSIS — R278 Other lack of coordination: Secondary | ICD-10-CM | POA: Diagnosis not present

## 2015-02-06 DIAGNOSIS — R1310 Dysphagia, unspecified: Secondary | ICD-10-CM | POA: Diagnosis not present

## 2015-02-06 DIAGNOSIS — R2689 Other abnormalities of gait and mobility: Secondary | ICD-10-CM | POA: Diagnosis not present

## 2015-02-06 DIAGNOSIS — Z9181 History of falling: Secondary | ICD-10-CM | POA: Diagnosis not present

## 2015-02-06 DIAGNOSIS — J449 Chronic obstructive pulmonary disease, unspecified: Secondary | ICD-10-CM | POA: Diagnosis not present

## 2015-02-06 DIAGNOSIS — R7989 Other specified abnormal findings of blood chemistry: Secondary | ICD-10-CM | POA: Diagnosis not present

## 2015-02-06 DIAGNOSIS — Z538 Procedure and treatment not carried out for other reasons: Secondary | ICD-10-CM | POA: Diagnosis not present

## 2015-02-06 DIAGNOSIS — N189 Chronic kidney disease, unspecified: Secondary | ICD-10-CM | POA: Diagnosis not present

## 2015-02-06 DIAGNOSIS — S52502D Unspecified fracture of the lower end of left radius, subsequent encounter for closed fracture with routine healing: Secondary | ICD-10-CM | POA: Diagnosis not present

## 2015-02-06 DIAGNOSIS — I129 Hypertensive chronic kidney disease with stage 1 through stage 4 chronic kidney disease, or unspecified chronic kidney disease: Secondary | ICD-10-CM | POA: Diagnosis not present

## 2015-02-06 DIAGNOSIS — J9611 Chronic respiratory failure with hypoxia: Secondary | ICD-10-CM | POA: Diagnosis not present

## 2015-02-06 DIAGNOSIS — I10 Essential (primary) hypertension: Secondary | ICD-10-CM | POA: Diagnosis not present

## 2015-02-06 DIAGNOSIS — I69391 Dysphagia following cerebral infarction: Secondary | ICD-10-CM | POA: Diagnosis not present

## 2015-02-06 DIAGNOSIS — Z79899 Other long term (current) drug therapy: Secondary | ICD-10-CM | POA: Diagnosis not present

## 2015-02-06 DIAGNOSIS — E039 Hypothyroidism, unspecified: Secondary | ICD-10-CM | POA: Diagnosis not present

## 2015-02-06 DIAGNOSIS — E7800 Pure hypercholesterolemia, unspecified: Secondary | ICD-10-CM | POA: Diagnosis not present

## 2015-02-06 DIAGNOSIS — I69951 Hemiplegia and hemiparesis following unspecified cerebrovascular disease affecting right dominant side: Secondary | ICD-10-CM | POA: Diagnosis not present

## 2015-02-06 DIAGNOSIS — N183 Chronic kidney disease, stage 3 (moderate): Secondary | ICD-10-CM | POA: Diagnosis not present

## 2015-02-06 DIAGNOSIS — G9340 Encephalopathy, unspecified: Secondary | ICD-10-CM | POA: Diagnosis not present

## 2015-02-06 DIAGNOSIS — I6350 Cerebral infarction due to unspecified occlusion or stenosis of unspecified cerebral artery: Secondary | ICD-10-CM | POA: Diagnosis not present

## 2015-02-06 DIAGNOSIS — R0602 Shortness of breath: Secondary | ICD-10-CM | POA: Diagnosis not present

## 2015-02-06 DIAGNOSIS — S52102D Unspecified fracture of upper end of left radius, subsequent encounter for closed fracture with routine healing: Secondary | ICD-10-CM | POA: Diagnosis not present

## 2015-02-06 DIAGNOSIS — S52602D Unspecified fracture of lower end of left ulna, subsequent encounter for closed fracture with routine healing: Secondary | ICD-10-CM | POA: Diagnosis not present

## 2015-02-06 DIAGNOSIS — I720 Aneurysm of carotid artery: Secondary | ICD-10-CM | POA: Diagnosis not present

## 2015-02-06 DIAGNOSIS — I69393 Ataxia following cerebral infarction: Secondary | ICD-10-CM | POA: Diagnosis not present

## 2015-02-06 DIAGNOSIS — I639 Cerebral infarction, unspecified: Secondary | ICD-10-CM | POA: Diagnosis not present

## 2015-02-06 DIAGNOSIS — D631 Anemia in chronic kidney disease: Secondary | ICD-10-CM | POA: Diagnosis not present

## 2015-02-06 DIAGNOSIS — Z23 Encounter for immunization: Secondary | ICD-10-CM | POA: Diagnosis not present

## 2015-02-06 DIAGNOSIS — R4182 Altered mental status, unspecified: Secondary | ICD-10-CM | POA: Diagnosis not present

## 2015-02-06 DIAGNOSIS — R410 Disorientation, unspecified: Secondary | ICD-10-CM | POA: Diagnosis not present

## 2015-02-06 DIAGNOSIS — M6281 Muscle weakness (generalized): Secondary | ICD-10-CM | POA: Diagnosis not present

## 2015-02-06 DIAGNOSIS — S52502A Unspecified fracture of the lower end of left radius, initial encounter for closed fracture: Secondary | ICD-10-CM | POA: Diagnosis not present

## 2015-02-11 DIAGNOSIS — J9610 Chronic respiratory failure, unspecified whether with hypoxia or hypercapnia: Secondary | ICD-10-CM | POA: Diagnosis not present

## 2015-02-11 DIAGNOSIS — Z538 Procedure and treatment not carried out for other reasons: Secondary | ICD-10-CM | POA: Diagnosis not present

## 2015-02-11 DIAGNOSIS — N2581 Secondary hyperparathyroidism of renal origin: Secondary | ICD-10-CM | POA: Diagnosis not present

## 2015-02-11 DIAGNOSIS — R410 Disorientation, unspecified: Secondary | ICD-10-CM | POA: Diagnosis not present

## 2015-02-11 DIAGNOSIS — I10 Essential (primary) hypertension: Secondary | ICD-10-CM | POA: Diagnosis not present

## 2015-02-11 DIAGNOSIS — I639 Cerebral infarction, unspecified: Secondary | ICD-10-CM | POA: Diagnosis not present

## 2015-02-11 DIAGNOSIS — N189 Chronic kidney disease, unspecified: Secondary | ICD-10-CM | POA: Diagnosis not present

## 2015-02-11 DIAGNOSIS — S52502A Unspecified fracture of the lower end of left radius, initial encounter for closed fracture: Secondary | ICD-10-CM | POA: Diagnosis not present

## 2015-02-11 DIAGNOSIS — N185 Chronic kidney disease, stage 5: Secondary | ICD-10-CM | POA: Diagnosis not present

## 2015-02-11 DIAGNOSIS — M109 Gout, unspecified: Secondary | ICD-10-CM | POA: Diagnosis not present

## 2015-02-11 DIAGNOSIS — E7800 Pure hypercholesterolemia, unspecified: Secondary | ICD-10-CM | POA: Diagnosis not present

## 2015-02-11 DIAGNOSIS — I6350 Cerebral infarction due to unspecified occlusion or stenosis of unspecified cerebral artery: Secondary | ICD-10-CM | POA: Diagnosis not present

## 2015-02-11 DIAGNOSIS — D631 Anemia in chronic kidney disease: Secondary | ICD-10-CM | POA: Diagnosis not present

## 2015-02-11 DIAGNOSIS — R488 Other symbolic dysfunctions: Secondary | ICD-10-CM | POA: Diagnosis not present

## 2015-02-11 DIAGNOSIS — S52102D Unspecified fracture of upper end of left radius, subsequent encounter for closed fracture with routine healing: Secondary | ICD-10-CM | POA: Diagnosis not present

## 2015-02-11 DIAGNOSIS — R4182 Altered mental status, unspecified: Secondary | ICD-10-CM | POA: Diagnosis not present

## 2015-02-11 DIAGNOSIS — M6281 Muscle weakness (generalized): Secondary | ICD-10-CM | POA: Diagnosis not present

## 2015-02-11 DIAGNOSIS — R2689 Other abnormalities of gait and mobility: Secondary | ICD-10-CM | POA: Diagnosis not present

## 2015-02-11 DIAGNOSIS — Z23 Encounter for immunization: Secondary | ICD-10-CM | POA: Diagnosis not present

## 2015-02-11 DIAGNOSIS — I69393 Ataxia following cerebral infarction: Secondary | ICD-10-CM | POA: Diagnosis not present

## 2015-02-11 DIAGNOSIS — R1310 Dysphagia, unspecified: Secondary | ICD-10-CM | POA: Diagnosis not present

## 2015-02-11 DIAGNOSIS — I720 Aneurysm of carotid artery: Secondary | ICD-10-CM | POA: Diagnosis not present

## 2015-02-11 DIAGNOSIS — N183 Chronic kidney disease, stage 3 (moderate): Secondary | ICD-10-CM | POA: Diagnosis not present

## 2015-02-11 DIAGNOSIS — R278 Other lack of coordination: Secondary | ICD-10-CM | POA: Diagnosis not present

## 2015-02-11 DIAGNOSIS — Z79899 Other long term (current) drug therapy: Secondary | ICD-10-CM | POA: Diagnosis not present

## 2015-02-11 DIAGNOSIS — I129 Hypertensive chronic kidney disease with stage 1 through stage 4 chronic kidney disease, or unspecified chronic kidney disease: Secondary | ICD-10-CM | POA: Diagnosis not present

## 2015-02-11 DIAGNOSIS — E039 Hypothyroidism, unspecified: Secondary | ICD-10-CM | POA: Diagnosis not present

## 2015-02-11 DIAGNOSIS — I69354 Hemiplegia and hemiparesis following cerebral infarction affecting left non-dominant side: Secondary | ICD-10-CM | POA: Diagnosis not present

## 2015-02-11 DIAGNOSIS — I69391 Dysphagia following cerebral infarction: Secondary | ICD-10-CM | POA: Diagnosis not present

## 2015-02-11 DIAGNOSIS — S52502D Unspecified fracture of the lower end of left radius, subsequent encounter for closed fracture with routine healing: Secondary | ICD-10-CM | POA: Diagnosis not present

## 2015-02-11 DIAGNOSIS — G92 Toxic encephalopathy: Secondary | ICD-10-CM | POA: Diagnosis not present

## 2015-02-11 DIAGNOSIS — N179 Acute kidney failure, unspecified: Secondary | ICD-10-CM | POA: Diagnosis not present

## 2015-02-11 DIAGNOSIS — S52002D Unspecified fracture of upper end of left ulna, subsequent encounter for closed fracture with routine healing: Secondary | ICD-10-CM | POA: Diagnosis not present

## 2015-02-11 DIAGNOSIS — F17210 Nicotine dependence, cigarettes, uncomplicated: Secondary | ICD-10-CM | POA: Diagnosis not present

## 2015-02-11 DIAGNOSIS — I120 Hypertensive chronic kidney disease with stage 5 chronic kidney disease or end stage renal disease: Secondary | ICD-10-CM | POA: Diagnosis not present

## 2015-02-11 DIAGNOSIS — J439 Emphysema, unspecified: Secondary | ICD-10-CM | POA: Diagnosis not present

## 2015-02-11 DIAGNOSIS — S52602D Unspecified fracture of lower end of left ulna, subsequent encounter for closed fracture with routine healing: Secondary | ICD-10-CM | POA: Diagnosis not present

## 2015-02-11 DIAGNOSIS — J449 Chronic obstructive pulmonary disease, unspecified: Secondary | ICD-10-CM | POA: Diagnosis not present

## 2015-02-11 DIAGNOSIS — G9340 Encephalopathy, unspecified: Secondary | ICD-10-CM | POA: Diagnosis not present

## 2015-02-11 DIAGNOSIS — Z9181 History of falling: Secondary | ICD-10-CM | POA: Diagnosis not present

## 2015-02-11 DIAGNOSIS — J9611 Chronic respiratory failure with hypoxia: Secondary | ICD-10-CM | POA: Diagnosis not present

## 2015-02-11 DIAGNOSIS — I69951 Hemiplegia and hemiparesis following unspecified cerebrovascular disease affecting right dominant side: Secondary | ICD-10-CM | POA: Diagnosis not present

## 2015-02-24 DIAGNOSIS — S52502A Unspecified fracture of the lower end of left radius, initial encounter for closed fracture: Secondary | ICD-10-CM | POA: Diagnosis not present

## 2015-02-27 DIAGNOSIS — I120 Hypertensive chronic kidney disease with stage 5 chronic kidney disease or end stage renal disease: Secondary | ICD-10-CM | POA: Diagnosis not present

## 2015-02-27 DIAGNOSIS — M109 Gout, unspecified: Secondary | ICD-10-CM | POA: Diagnosis not present

## 2015-02-27 DIAGNOSIS — N185 Chronic kidney disease, stage 5: Secondary | ICD-10-CM | POA: Diagnosis not present

## 2015-02-27 DIAGNOSIS — N2581 Secondary hyperparathyroidism of renal origin: Secondary | ICD-10-CM | POA: Diagnosis not present

## 2015-02-27 DIAGNOSIS — D631 Anemia in chronic kidney disease: Secondary | ICD-10-CM | POA: Diagnosis not present

## 2015-03-03 ENCOUNTER — Telehealth (HOSPITAL_COMMUNITY): Payer: Self-pay

## 2015-03-03 DIAGNOSIS — R4182 Altered mental status, unspecified: Secondary | ICD-10-CM | POA: Diagnosis not present

## 2015-03-03 NOTE — Telephone Encounter (Signed)
Called to confirm that pt did not want to go through with aneurysm coiling. Pt's son Juanda Crumble stated that this was correct after speaking with their Dr. Deborha Payment decided that the risks were greater than the benefits and declined. I told them to give [place] a call if anything changes. He agreed to do so. AW

## 2015-03-04 ENCOUNTER — Other Ambulatory Visit: Payer: Self-pay | Admitting: Nephrology

## 2015-03-04 DIAGNOSIS — N183 Chronic kidney disease, stage 3 (moderate): Secondary | ICD-10-CM | POA: Diagnosis not present

## 2015-03-04 DIAGNOSIS — I10 Essential (primary) hypertension: Secondary | ICD-10-CM | POA: Diagnosis not present

## 2015-03-04 DIAGNOSIS — N185 Chronic kidney disease, stage 5: Secondary | ICD-10-CM

## 2015-03-04 DIAGNOSIS — G9340 Encephalopathy, unspecified: Secondary | ICD-10-CM | POA: Diagnosis not present

## 2015-03-04 DIAGNOSIS — J449 Chronic obstructive pulmonary disease, unspecified: Secondary | ICD-10-CM | POA: Diagnosis not present

## 2015-03-06 DIAGNOSIS — S52202D Unspecified fracture of shaft of left ulna, subsequent encounter for closed fracture with routine healing: Secondary | ICD-10-CM | POA: Diagnosis not present

## 2015-03-06 DIAGNOSIS — N189 Chronic kidney disease, unspecified: Secondary | ICD-10-CM | POA: Diagnosis not present

## 2015-03-06 DIAGNOSIS — F419 Anxiety disorder, unspecified: Secondary | ICD-10-CM | POA: Diagnosis not present

## 2015-03-06 DIAGNOSIS — S52502D Unspecified fracture of the lower end of left radius, subsequent encounter for closed fracture with routine healing: Secondary | ICD-10-CM | POA: Diagnosis not present

## 2015-03-06 DIAGNOSIS — D649 Anemia, unspecified: Secondary | ICD-10-CM | POA: Diagnosis not present

## 2015-03-06 DIAGNOSIS — I130 Hypertensive heart and chronic kidney disease with heart failure and stage 1 through stage 4 chronic kidney disease, or unspecified chronic kidney disease: Secondary | ICD-10-CM | POA: Diagnosis not present

## 2015-03-06 DIAGNOSIS — I69351 Hemiplegia and hemiparesis following cerebral infarction affecting right dominant side: Secondary | ICD-10-CM | POA: Diagnosis not present

## 2015-03-06 DIAGNOSIS — F17200 Nicotine dependence, unspecified, uncomplicated: Secondary | ICD-10-CM | POA: Diagnosis not present

## 2015-03-06 DIAGNOSIS — I519 Heart disease, unspecified: Secondary | ICD-10-CM | POA: Diagnosis not present

## 2015-03-06 DIAGNOSIS — J9611 Chronic respiratory failure with hypoxia: Secondary | ICD-10-CM | POA: Diagnosis not present

## 2015-03-06 DIAGNOSIS — Z9981 Dependence on supplemental oxygen: Secondary | ICD-10-CM | POA: Diagnosis not present

## 2015-03-06 DIAGNOSIS — J449 Chronic obstructive pulmonary disease, unspecified: Secondary | ICD-10-CM | POA: Diagnosis not present

## 2015-03-06 DIAGNOSIS — Z79891 Long term (current) use of opiate analgesic: Secondary | ICD-10-CM | POA: Diagnosis not present

## 2015-03-10 DIAGNOSIS — J449 Chronic obstructive pulmonary disease, unspecified: Secondary | ICD-10-CM | POA: Diagnosis not present

## 2015-03-10 DIAGNOSIS — I519 Heart disease, unspecified: Secondary | ICD-10-CM | POA: Diagnosis not present

## 2015-03-10 DIAGNOSIS — N189 Chronic kidney disease, unspecified: Secondary | ICD-10-CM | POA: Diagnosis not present

## 2015-03-10 DIAGNOSIS — D649 Anemia, unspecified: Secondary | ICD-10-CM | POA: Diagnosis not present

## 2015-03-10 DIAGNOSIS — F419 Anxiety disorder, unspecified: Secondary | ICD-10-CM | POA: Diagnosis not present

## 2015-03-10 DIAGNOSIS — S52202D Unspecified fracture of shaft of left ulna, subsequent encounter for closed fracture with routine healing: Secondary | ICD-10-CM | POA: Diagnosis not present

## 2015-03-10 DIAGNOSIS — Z79891 Long term (current) use of opiate analgesic: Secondary | ICD-10-CM | POA: Diagnosis not present

## 2015-03-10 DIAGNOSIS — Z9981 Dependence on supplemental oxygen: Secondary | ICD-10-CM | POA: Diagnosis not present

## 2015-03-10 DIAGNOSIS — J9611 Chronic respiratory failure with hypoxia: Secondary | ICD-10-CM | POA: Diagnosis not present

## 2015-03-10 DIAGNOSIS — I130 Hypertensive heart and chronic kidney disease with heart failure and stage 1 through stage 4 chronic kidney disease, or unspecified chronic kidney disease: Secondary | ICD-10-CM | POA: Diagnosis not present

## 2015-03-10 DIAGNOSIS — S52502D Unspecified fracture of the lower end of left radius, subsequent encounter for closed fracture with routine healing: Secondary | ICD-10-CM | POA: Diagnosis not present

## 2015-03-10 DIAGNOSIS — I69351 Hemiplegia and hemiparesis following cerebral infarction affecting right dominant side: Secondary | ICD-10-CM | POA: Diagnosis not present

## 2015-03-10 DIAGNOSIS — F17200 Nicotine dependence, unspecified, uncomplicated: Secondary | ICD-10-CM | POA: Diagnosis not present

## 2015-03-11 ENCOUNTER — Other Ambulatory Visit: Payer: Self-pay

## 2015-03-12 DIAGNOSIS — J449 Chronic obstructive pulmonary disease, unspecified: Secondary | ICD-10-CM | POA: Diagnosis not present

## 2015-03-12 DIAGNOSIS — S52202D Unspecified fracture of shaft of left ulna, subsequent encounter for closed fracture with routine healing: Secondary | ICD-10-CM | POA: Diagnosis not present

## 2015-03-12 DIAGNOSIS — Z9981 Dependence on supplemental oxygen: Secondary | ICD-10-CM | POA: Diagnosis not present

## 2015-03-12 DIAGNOSIS — I69351 Hemiplegia and hemiparesis following cerebral infarction affecting right dominant side: Secondary | ICD-10-CM | POA: Diagnosis not present

## 2015-03-12 DIAGNOSIS — Z79891 Long term (current) use of opiate analgesic: Secondary | ICD-10-CM | POA: Diagnosis not present

## 2015-03-12 DIAGNOSIS — F419 Anxiety disorder, unspecified: Secondary | ICD-10-CM | POA: Diagnosis not present

## 2015-03-12 DIAGNOSIS — F17200 Nicotine dependence, unspecified, uncomplicated: Secondary | ICD-10-CM | POA: Diagnosis not present

## 2015-03-12 DIAGNOSIS — I519 Heart disease, unspecified: Secondary | ICD-10-CM | POA: Diagnosis not present

## 2015-03-12 DIAGNOSIS — I130 Hypertensive heart and chronic kidney disease with heart failure and stage 1 through stage 4 chronic kidney disease, or unspecified chronic kidney disease: Secondary | ICD-10-CM | POA: Diagnosis not present

## 2015-03-12 DIAGNOSIS — D649 Anemia, unspecified: Secondary | ICD-10-CM | POA: Diagnosis not present

## 2015-03-12 DIAGNOSIS — N189 Chronic kidney disease, unspecified: Secondary | ICD-10-CM | POA: Diagnosis not present

## 2015-03-12 DIAGNOSIS — J9611 Chronic respiratory failure with hypoxia: Secondary | ICD-10-CM | POA: Diagnosis not present

## 2015-03-12 DIAGNOSIS — S52502D Unspecified fracture of the lower end of left radius, subsequent encounter for closed fracture with routine healing: Secondary | ICD-10-CM | POA: Diagnosis not present

## 2015-03-13 DIAGNOSIS — J9611 Chronic respiratory failure with hypoxia: Secondary | ICD-10-CM | POA: Diagnosis not present

## 2015-03-13 DIAGNOSIS — I69351 Hemiplegia and hemiparesis following cerebral infarction affecting right dominant side: Secondary | ICD-10-CM | POA: Diagnosis not present

## 2015-03-13 DIAGNOSIS — N189 Chronic kidney disease, unspecified: Secondary | ICD-10-CM | POA: Diagnosis not present

## 2015-03-13 DIAGNOSIS — I130 Hypertensive heart and chronic kidney disease with heart failure and stage 1 through stage 4 chronic kidney disease, or unspecified chronic kidney disease: Secondary | ICD-10-CM | POA: Diagnosis not present

## 2015-03-13 DIAGNOSIS — S52202D Unspecified fracture of shaft of left ulna, subsequent encounter for closed fracture with routine healing: Secondary | ICD-10-CM | POA: Diagnosis not present

## 2015-03-13 DIAGNOSIS — Z79891 Long term (current) use of opiate analgesic: Secondary | ICD-10-CM | POA: Diagnosis not present

## 2015-03-13 DIAGNOSIS — D649 Anemia, unspecified: Secondary | ICD-10-CM | POA: Diagnosis not present

## 2015-03-13 DIAGNOSIS — J449 Chronic obstructive pulmonary disease, unspecified: Secondary | ICD-10-CM | POA: Diagnosis not present

## 2015-03-13 DIAGNOSIS — S52502D Unspecified fracture of the lower end of left radius, subsequent encounter for closed fracture with routine healing: Secondary | ICD-10-CM | POA: Diagnosis not present

## 2015-03-13 DIAGNOSIS — Z9981 Dependence on supplemental oxygen: Secondary | ICD-10-CM | POA: Diagnosis not present

## 2015-03-13 DIAGNOSIS — I519 Heart disease, unspecified: Secondary | ICD-10-CM | POA: Diagnosis not present

## 2015-03-13 DIAGNOSIS — F419 Anxiety disorder, unspecified: Secondary | ICD-10-CM | POA: Diagnosis not present

## 2015-03-13 DIAGNOSIS — F17200 Nicotine dependence, unspecified, uncomplicated: Secondary | ICD-10-CM | POA: Diagnosis not present

## 2015-03-16 ENCOUNTER — Ambulatory Visit
Admission: RE | Admit: 2015-03-16 | Discharge: 2015-03-16 | Disposition: A | Discharge status: Home / Self Care | Payer: Medicare Other | Source: Ambulatory Visit | Attending: Nephrology | Admitting: Nephrology

## 2015-03-16 DIAGNOSIS — N185 Chronic kidney disease, stage 5: Secondary | ICD-10-CM | POA: Diagnosis not present

## 2015-03-17 DIAGNOSIS — I69351 Hemiplegia and hemiparesis following cerebral infarction affecting right dominant side: Secondary | ICD-10-CM | POA: Diagnosis not present

## 2015-03-17 DIAGNOSIS — D649 Anemia, unspecified: Secondary | ICD-10-CM | POA: Diagnosis not present

## 2015-03-17 DIAGNOSIS — F419 Anxiety disorder, unspecified: Secondary | ICD-10-CM | POA: Diagnosis not present

## 2015-03-17 DIAGNOSIS — Z79891 Long term (current) use of opiate analgesic: Secondary | ICD-10-CM | POA: Diagnosis not present

## 2015-03-17 DIAGNOSIS — J9611 Chronic respiratory failure with hypoxia: Secondary | ICD-10-CM | POA: Diagnosis not present

## 2015-03-17 DIAGNOSIS — S52502D Unspecified fracture of the lower end of left radius, subsequent encounter for closed fracture with routine healing: Secondary | ICD-10-CM | POA: Diagnosis not present

## 2015-03-17 DIAGNOSIS — N189 Chronic kidney disease, unspecified: Secondary | ICD-10-CM | POA: Diagnosis not present

## 2015-03-17 DIAGNOSIS — I519 Heart disease, unspecified: Secondary | ICD-10-CM | POA: Diagnosis not present

## 2015-03-17 DIAGNOSIS — F411 Generalized anxiety disorder: Secondary | ICD-10-CM | POA: Diagnosis not present

## 2015-03-17 DIAGNOSIS — F17200 Nicotine dependence, unspecified, uncomplicated: Secondary | ICD-10-CM | POA: Diagnosis not present

## 2015-03-17 DIAGNOSIS — S52202D Unspecified fracture of shaft of left ulna, subsequent encounter for closed fracture with routine healing: Secondary | ICD-10-CM | POA: Diagnosis not present

## 2015-03-17 DIAGNOSIS — I130 Hypertensive heart and chronic kidney disease with heart failure and stage 1 through stage 4 chronic kidney disease, or unspecified chronic kidney disease: Secondary | ICD-10-CM | POA: Diagnosis not present

## 2015-03-17 DIAGNOSIS — Z9981 Dependence on supplemental oxygen: Secondary | ICD-10-CM | POA: Diagnosis not present

## 2015-03-17 DIAGNOSIS — J449 Chronic obstructive pulmonary disease, unspecified: Secondary | ICD-10-CM | POA: Diagnosis not present

## 2015-03-17 DIAGNOSIS — R4182 Altered mental status, unspecified: Secondary | ICD-10-CM | POA: Diagnosis not present

## 2015-03-18 DIAGNOSIS — Z9981 Dependence on supplemental oxygen: Secondary | ICD-10-CM | POA: Diagnosis not present

## 2015-03-18 DIAGNOSIS — I130 Hypertensive heart and chronic kidney disease with heart failure and stage 1 through stage 4 chronic kidney disease, or unspecified chronic kidney disease: Secondary | ICD-10-CM | POA: Diagnosis not present

## 2015-03-18 DIAGNOSIS — S52202D Unspecified fracture of shaft of left ulna, subsequent encounter for closed fracture with routine healing: Secondary | ICD-10-CM | POA: Diagnosis not present

## 2015-03-18 DIAGNOSIS — S52502D Unspecified fracture of the lower end of left radius, subsequent encounter for closed fracture with routine healing: Secondary | ICD-10-CM | POA: Diagnosis not present

## 2015-03-18 DIAGNOSIS — J449 Chronic obstructive pulmonary disease, unspecified: Secondary | ICD-10-CM | POA: Diagnosis not present

## 2015-03-18 DIAGNOSIS — I69351 Hemiplegia and hemiparesis following cerebral infarction affecting right dominant side: Secondary | ICD-10-CM | POA: Diagnosis not present

## 2015-03-18 DIAGNOSIS — N189 Chronic kidney disease, unspecified: Secondary | ICD-10-CM | POA: Diagnosis not present

## 2015-03-18 DIAGNOSIS — I519 Heart disease, unspecified: Secondary | ICD-10-CM | POA: Diagnosis not present

## 2015-03-18 DIAGNOSIS — N390 Urinary tract infection, site not specified: Secondary | ICD-10-CM | POA: Diagnosis not present

## 2015-03-18 DIAGNOSIS — J9611 Chronic respiratory failure with hypoxia: Secondary | ICD-10-CM | POA: Diagnosis not present

## 2015-03-18 DIAGNOSIS — Z79891 Long term (current) use of opiate analgesic: Secondary | ICD-10-CM | POA: Diagnosis not present

## 2015-03-18 DIAGNOSIS — D649 Anemia, unspecified: Secondary | ICD-10-CM | POA: Diagnosis not present

## 2015-03-18 DIAGNOSIS — F419 Anxiety disorder, unspecified: Secondary | ICD-10-CM | POA: Diagnosis not present

## 2015-03-18 DIAGNOSIS — F17200 Nicotine dependence, unspecified, uncomplicated: Secondary | ICD-10-CM | POA: Diagnosis not present

## 2015-03-24 DIAGNOSIS — S52202D Unspecified fracture of shaft of left ulna, subsequent encounter for closed fracture with routine healing: Secondary | ICD-10-CM | POA: Diagnosis not present

## 2015-03-24 DIAGNOSIS — F17200 Nicotine dependence, unspecified, uncomplicated: Secondary | ICD-10-CM | POA: Diagnosis not present

## 2015-03-24 DIAGNOSIS — I130 Hypertensive heart and chronic kidney disease with heart failure and stage 1 through stage 4 chronic kidney disease, or unspecified chronic kidney disease: Secondary | ICD-10-CM | POA: Diagnosis not present

## 2015-03-24 DIAGNOSIS — F419 Anxiety disorder, unspecified: Secondary | ICD-10-CM | POA: Diagnosis not present

## 2015-03-24 DIAGNOSIS — D649 Anemia, unspecified: Secondary | ICD-10-CM | POA: Diagnosis not present

## 2015-03-24 DIAGNOSIS — Z9981 Dependence on supplemental oxygen: Secondary | ICD-10-CM | POA: Diagnosis not present

## 2015-03-24 DIAGNOSIS — Z79891 Long term (current) use of opiate analgesic: Secondary | ICD-10-CM | POA: Diagnosis not present

## 2015-03-24 DIAGNOSIS — J9611 Chronic respiratory failure with hypoxia: Secondary | ICD-10-CM | POA: Diagnosis not present

## 2015-03-24 DIAGNOSIS — S52502A Unspecified fracture of the lower end of left radius, initial encounter for closed fracture: Secondary | ICD-10-CM | POA: Diagnosis not present

## 2015-03-24 DIAGNOSIS — J449 Chronic obstructive pulmonary disease, unspecified: Secondary | ICD-10-CM | POA: Diagnosis not present

## 2015-03-24 DIAGNOSIS — I69351 Hemiplegia and hemiparesis following cerebral infarction affecting right dominant side: Secondary | ICD-10-CM | POA: Diagnosis not present

## 2015-03-24 DIAGNOSIS — N189 Chronic kidney disease, unspecified: Secondary | ICD-10-CM | POA: Diagnosis not present

## 2015-03-24 DIAGNOSIS — I519 Heart disease, unspecified: Secondary | ICD-10-CM | POA: Diagnosis not present

## 2015-03-24 DIAGNOSIS — S52502D Unspecified fracture of the lower end of left radius, subsequent encounter for closed fracture with routine healing: Secondary | ICD-10-CM | POA: Diagnosis not present

## 2015-03-25 DIAGNOSIS — D649 Anemia, unspecified: Secondary | ICD-10-CM | POA: Diagnosis not present

## 2015-03-25 DIAGNOSIS — J449 Chronic obstructive pulmonary disease, unspecified: Secondary | ICD-10-CM | POA: Diagnosis not present

## 2015-03-25 DIAGNOSIS — J9611 Chronic respiratory failure with hypoxia: Secondary | ICD-10-CM | POA: Diagnosis not present

## 2015-03-25 DIAGNOSIS — F17200 Nicotine dependence, unspecified, uncomplicated: Secondary | ICD-10-CM | POA: Diagnosis not present

## 2015-03-25 DIAGNOSIS — I69351 Hemiplegia and hemiparesis following cerebral infarction affecting right dominant side: Secondary | ICD-10-CM | POA: Diagnosis not present

## 2015-03-25 DIAGNOSIS — I130 Hypertensive heart and chronic kidney disease with heart failure and stage 1 through stage 4 chronic kidney disease, or unspecified chronic kidney disease: Secondary | ICD-10-CM | POA: Diagnosis not present

## 2015-03-25 DIAGNOSIS — N189 Chronic kidney disease, unspecified: Secondary | ICD-10-CM | POA: Diagnosis not present

## 2015-03-25 DIAGNOSIS — I519 Heart disease, unspecified: Secondary | ICD-10-CM | POA: Diagnosis not present

## 2015-03-25 DIAGNOSIS — Z9981 Dependence on supplemental oxygen: Secondary | ICD-10-CM | POA: Diagnosis not present

## 2015-03-25 DIAGNOSIS — S52202D Unspecified fracture of shaft of left ulna, subsequent encounter for closed fracture with routine healing: Secondary | ICD-10-CM | POA: Diagnosis not present

## 2015-03-25 DIAGNOSIS — Z79891 Long term (current) use of opiate analgesic: Secondary | ICD-10-CM | POA: Diagnosis not present

## 2015-03-25 DIAGNOSIS — S52502D Unspecified fracture of the lower end of left radius, subsequent encounter for closed fracture with routine healing: Secondary | ICD-10-CM | POA: Diagnosis not present

## 2015-03-25 DIAGNOSIS — F419 Anxiety disorder, unspecified: Secondary | ICD-10-CM | POA: Diagnosis not present

## 2015-03-26 DIAGNOSIS — F419 Anxiety disorder, unspecified: Secondary | ICD-10-CM | POA: Diagnosis not present

## 2015-03-26 DIAGNOSIS — J449 Chronic obstructive pulmonary disease, unspecified: Secondary | ICD-10-CM | POA: Diagnosis not present

## 2015-03-26 DIAGNOSIS — F17200 Nicotine dependence, unspecified, uncomplicated: Secondary | ICD-10-CM | POA: Diagnosis not present

## 2015-03-26 DIAGNOSIS — Z79891 Long term (current) use of opiate analgesic: Secondary | ICD-10-CM | POA: Diagnosis not present

## 2015-03-26 DIAGNOSIS — I130 Hypertensive heart and chronic kidney disease with heart failure and stage 1 through stage 4 chronic kidney disease, or unspecified chronic kidney disease: Secondary | ICD-10-CM | POA: Diagnosis not present

## 2015-03-26 DIAGNOSIS — I519 Heart disease, unspecified: Secondary | ICD-10-CM | POA: Diagnosis not present

## 2015-03-26 DIAGNOSIS — S52502D Unspecified fracture of the lower end of left radius, subsequent encounter for closed fracture with routine healing: Secondary | ICD-10-CM | POA: Diagnosis not present

## 2015-03-26 DIAGNOSIS — Z9981 Dependence on supplemental oxygen: Secondary | ICD-10-CM | POA: Diagnosis not present

## 2015-03-26 DIAGNOSIS — D649 Anemia, unspecified: Secondary | ICD-10-CM | POA: Diagnosis not present

## 2015-03-26 DIAGNOSIS — J9611 Chronic respiratory failure with hypoxia: Secondary | ICD-10-CM | POA: Diagnosis not present

## 2015-03-26 DIAGNOSIS — S52202D Unspecified fracture of shaft of left ulna, subsequent encounter for closed fracture with routine healing: Secondary | ICD-10-CM | POA: Diagnosis not present

## 2015-03-26 DIAGNOSIS — I69351 Hemiplegia and hemiparesis following cerebral infarction affecting right dominant side: Secondary | ICD-10-CM | POA: Diagnosis not present

## 2015-03-26 DIAGNOSIS — N189 Chronic kidney disease, unspecified: Secondary | ICD-10-CM | POA: Diagnosis not present

## 2015-03-27 DIAGNOSIS — Z9981 Dependence on supplemental oxygen: Secondary | ICD-10-CM | POA: Diagnosis not present

## 2015-03-27 DIAGNOSIS — N189 Chronic kidney disease, unspecified: Secondary | ICD-10-CM | POA: Diagnosis not present

## 2015-03-27 DIAGNOSIS — I519 Heart disease, unspecified: Secondary | ICD-10-CM | POA: Diagnosis not present

## 2015-03-27 DIAGNOSIS — I69351 Hemiplegia and hemiparesis following cerebral infarction affecting right dominant side: Secondary | ICD-10-CM | POA: Diagnosis not present

## 2015-03-27 DIAGNOSIS — J9611 Chronic respiratory failure with hypoxia: Secondary | ICD-10-CM | POA: Diagnosis not present

## 2015-03-27 DIAGNOSIS — D649 Anemia, unspecified: Secondary | ICD-10-CM | POA: Diagnosis not present

## 2015-03-27 DIAGNOSIS — F419 Anxiety disorder, unspecified: Secondary | ICD-10-CM | POA: Diagnosis not present

## 2015-03-27 DIAGNOSIS — S52502D Unspecified fracture of the lower end of left radius, subsequent encounter for closed fracture with routine healing: Secondary | ICD-10-CM | POA: Diagnosis not present

## 2015-03-27 DIAGNOSIS — I130 Hypertensive heart and chronic kidney disease with heart failure and stage 1 through stage 4 chronic kidney disease, or unspecified chronic kidney disease: Secondary | ICD-10-CM | POA: Diagnosis not present

## 2015-03-27 DIAGNOSIS — J449 Chronic obstructive pulmonary disease, unspecified: Secondary | ICD-10-CM | POA: Diagnosis not present

## 2015-03-27 DIAGNOSIS — Z79891 Long term (current) use of opiate analgesic: Secondary | ICD-10-CM | POA: Diagnosis not present

## 2015-03-27 DIAGNOSIS — F17200 Nicotine dependence, unspecified, uncomplicated: Secondary | ICD-10-CM | POA: Diagnosis not present

## 2015-03-27 DIAGNOSIS — S52202D Unspecified fracture of shaft of left ulna, subsequent encounter for closed fracture with routine healing: Secondary | ICD-10-CM | POA: Diagnosis not present

## 2015-03-31 DIAGNOSIS — Z9981 Dependence on supplemental oxygen: Secondary | ICD-10-CM | POA: Diagnosis not present

## 2015-03-31 DIAGNOSIS — F17200 Nicotine dependence, unspecified, uncomplicated: Secondary | ICD-10-CM | POA: Diagnosis not present

## 2015-03-31 DIAGNOSIS — I519 Heart disease, unspecified: Secondary | ICD-10-CM | POA: Diagnosis not present

## 2015-03-31 DIAGNOSIS — I69351 Hemiplegia and hemiparesis following cerebral infarction affecting right dominant side: Secondary | ICD-10-CM | POA: Diagnosis not present

## 2015-03-31 DIAGNOSIS — J9611 Chronic respiratory failure with hypoxia: Secondary | ICD-10-CM | POA: Diagnosis not present

## 2015-03-31 DIAGNOSIS — S52502D Unspecified fracture of the lower end of left radius, subsequent encounter for closed fracture with routine healing: Secondary | ICD-10-CM | POA: Diagnosis not present

## 2015-03-31 DIAGNOSIS — D649 Anemia, unspecified: Secondary | ICD-10-CM | POA: Diagnosis not present

## 2015-03-31 DIAGNOSIS — Z79891 Long term (current) use of opiate analgesic: Secondary | ICD-10-CM | POA: Diagnosis not present

## 2015-03-31 DIAGNOSIS — S52202D Unspecified fracture of shaft of left ulna, subsequent encounter for closed fracture with routine healing: Secondary | ICD-10-CM | POA: Diagnosis not present

## 2015-03-31 DIAGNOSIS — I130 Hypertensive heart and chronic kidney disease with heart failure and stage 1 through stage 4 chronic kidney disease, or unspecified chronic kidney disease: Secondary | ICD-10-CM | POA: Diagnosis not present

## 2015-03-31 DIAGNOSIS — J449 Chronic obstructive pulmonary disease, unspecified: Secondary | ICD-10-CM | POA: Diagnosis not present

## 2015-03-31 DIAGNOSIS — F419 Anxiety disorder, unspecified: Secondary | ICD-10-CM | POA: Diagnosis not present

## 2015-03-31 DIAGNOSIS — N189 Chronic kidney disease, unspecified: Secondary | ICD-10-CM | POA: Diagnosis not present

## 2015-04-01 DIAGNOSIS — Z9981 Dependence on supplemental oxygen: Secondary | ICD-10-CM | POA: Diagnosis not present

## 2015-04-01 DIAGNOSIS — J449 Chronic obstructive pulmonary disease, unspecified: Secondary | ICD-10-CM | POA: Diagnosis not present

## 2015-04-01 DIAGNOSIS — S52202D Unspecified fracture of shaft of left ulna, subsequent encounter for closed fracture with routine healing: Secondary | ICD-10-CM | POA: Diagnosis not present

## 2015-04-01 DIAGNOSIS — I69351 Hemiplegia and hemiparesis following cerebral infarction affecting right dominant side: Secondary | ICD-10-CM | POA: Diagnosis not present

## 2015-04-01 DIAGNOSIS — J9611 Chronic respiratory failure with hypoxia: Secondary | ICD-10-CM | POA: Diagnosis not present

## 2015-04-01 DIAGNOSIS — F419 Anxiety disorder, unspecified: Secondary | ICD-10-CM | POA: Diagnosis not present

## 2015-04-01 DIAGNOSIS — D649 Anemia, unspecified: Secondary | ICD-10-CM | POA: Diagnosis not present

## 2015-04-01 DIAGNOSIS — F17200 Nicotine dependence, unspecified, uncomplicated: Secondary | ICD-10-CM | POA: Diagnosis not present

## 2015-04-01 DIAGNOSIS — N189 Chronic kidney disease, unspecified: Secondary | ICD-10-CM | POA: Diagnosis not present

## 2015-04-01 DIAGNOSIS — S52502D Unspecified fracture of the lower end of left radius, subsequent encounter for closed fracture with routine healing: Secondary | ICD-10-CM | POA: Diagnosis not present

## 2015-04-01 DIAGNOSIS — I130 Hypertensive heart and chronic kidney disease with heart failure and stage 1 through stage 4 chronic kidney disease, or unspecified chronic kidney disease: Secondary | ICD-10-CM | POA: Diagnosis not present

## 2015-04-01 DIAGNOSIS — Z79891 Long term (current) use of opiate analgesic: Secondary | ICD-10-CM | POA: Diagnosis not present

## 2015-04-01 DIAGNOSIS — I519 Heart disease, unspecified: Secondary | ICD-10-CM | POA: Diagnosis not present

## 2015-04-02 DIAGNOSIS — I519 Heart disease, unspecified: Secondary | ICD-10-CM | POA: Diagnosis not present

## 2015-04-02 DIAGNOSIS — I69351 Hemiplegia and hemiparesis following cerebral infarction affecting right dominant side: Secondary | ICD-10-CM | POA: Diagnosis not present

## 2015-04-02 DIAGNOSIS — J449 Chronic obstructive pulmonary disease, unspecified: Secondary | ICD-10-CM | POA: Diagnosis not present

## 2015-04-02 DIAGNOSIS — I130 Hypertensive heart and chronic kidney disease with heart failure and stage 1 through stage 4 chronic kidney disease, or unspecified chronic kidney disease: Secondary | ICD-10-CM | POA: Diagnosis not present

## 2015-04-02 DIAGNOSIS — F17200 Nicotine dependence, unspecified, uncomplicated: Secondary | ICD-10-CM | POA: Diagnosis not present

## 2015-04-02 DIAGNOSIS — D649 Anemia, unspecified: Secondary | ICD-10-CM | POA: Diagnosis not present

## 2015-04-02 DIAGNOSIS — N189 Chronic kidney disease, unspecified: Secondary | ICD-10-CM | POA: Diagnosis not present

## 2015-04-02 DIAGNOSIS — S52202D Unspecified fracture of shaft of left ulna, subsequent encounter for closed fracture with routine healing: Secondary | ICD-10-CM | POA: Diagnosis not present

## 2015-04-02 DIAGNOSIS — Z9981 Dependence on supplemental oxygen: Secondary | ICD-10-CM | POA: Diagnosis not present

## 2015-04-02 DIAGNOSIS — J9611 Chronic respiratory failure with hypoxia: Secondary | ICD-10-CM | POA: Diagnosis not present

## 2015-04-02 DIAGNOSIS — S52502D Unspecified fracture of the lower end of left radius, subsequent encounter for closed fracture with routine healing: Secondary | ICD-10-CM | POA: Diagnosis not present

## 2015-04-02 DIAGNOSIS — F419 Anxiety disorder, unspecified: Secondary | ICD-10-CM | POA: Diagnosis not present

## 2015-04-02 DIAGNOSIS — Z79891 Long term (current) use of opiate analgesic: Secondary | ICD-10-CM | POA: Diagnosis not present

## 2015-04-04 DIAGNOSIS — J9610 Chronic respiratory failure, unspecified whether with hypoxia or hypercapnia: Secondary | ICD-10-CM | POA: Diagnosis not present

## 2015-04-08 DIAGNOSIS — I130 Hypertensive heart and chronic kidney disease with heart failure and stage 1 through stage 4 chronic kidney disease, or unspecified chronic kidney disease: Secondary | ICD-10-CM | POA: Diagnosis not present

## 2015-04-08 DIAGNOSIS — Z79891 Long term (current) use of opiate analgesic: Secondary | ICD-10-CM | POA: Diagnosis not present

## 2015-04-08 DIAGNOSIS — Z9981 Dependence on supplemental oxygen: Secondary | ICD-10-CM | POA: Diagnosis not present

## 2015-04-08 DIAGNOSIS — S52502D Unspecified fracture of the lower end of left radius, subsequent encounter for closed fracture with routine healing: Secondary | ICD-10-CM | POA: Diagnosis not present

## 2015-04-08 DIAGNOSIS — D649 Anemia, unspecified: Secondary | ICD-10-CM | POA: Diagnosis not present

## 2015-04-08 DIAGNOSIS — J9611 Chronic respiratory failure with hypoxia: Secondary | ICD-10-CM | POA: Diagnosis not present

## 2015-04-08 DIAGNOSIS — I69351 Hemiplegia and hemiparesis following cerebral infarction affecting right dominant side: Secondary | ICD-10-CM | POA: Diagnosis not present

## 2015-04-08 DIAGNOSIS — F17200 Nicotine dependence, unspecified, uncomplicated: Secondary | ICD-10-CM | POA: Diagnosis not present

## 2015-04-08 DIAGNOSIS — F419 Anxiety disorder, unspecified: Secondary | ICD-10-CM | POA: Diagnosis not present

## 2015-04-08 DIAGNOSIS — S52202D Unspecified fracture of shaft of left ulna, subsequent encounter for closed fracture with routine healing: Secondary | ICD-10-CM | POA: Diagnosis not present

## 2015-04-08 DIAGNOSIS — I519 Heart disease, unspecified: Secondary | ICD-10-CM | POA: Diagnosis not present

## 2015-04-08 DIAGNOSIS — J449 Chronic obstructive pulmonary disease, unspecified: Secondary | ICD-10-CM | POA: Diagnosis not present

## 2015-04-08 DIAGNOSIS — N189 Chronic kidney disease, unspecified: Secondary | ICD-10-CM | POA: Diagnosis not present

## 2015-04-09 DIAGNOSIS — D649 Anemia, unspecified: Secondary | ICD-10-CM | POA: Diagnosis not present

## 2015-04-09 DIAGNOSIS — S52502D Unspecified fracture of the lower end of left radius, subsequent encounter for closed fracture with routine healing: Secondary | ICD-10-CM | POA: Diagnosis not present

## 2015-04-09 DIAGNOSIS — F419 Anxiety disorder, unspecified: Secondary | ICD-10-CM | POA: Diagnosis not present

## 2015-04-09 DIAGNOSIS — Z9981 Dependence on supplemental oxygen: Secondary | ICD-10-CM | POA: Diagnosis not present

## 2015-04-09 DIAGNOSIS — I130 Hypertensive heart and chronic kidney disease with heart failure and stage 1 through stage 4 chronic kidney disease, or unspecified chronic kidney disease: Secondary | ICD-10-CM | POA: Diagnosis not present

## 2015-04-09 DIAGNOSIS — J9611 Chronic respiratory failure with hypoxia: Secondary | ICD-10-CM | POA: Diagnosis not present

## 2015-04-09 DIAGNOSIS — Z79891 Long term (current) use of opiate analgesic: Secondary | ICD-10-CM | POA: Diagnosis not present

## 2015-04-09 DIAGNOSIS — N189 Chronic kidney disease, unspecified: Secondary | ICD-10-CM | POA: Diagnosis not present

## 2015-04-09 DIAGNOSIS — F17200 Nicotine dependence, unspecified, uncomplicated: Secondary | ICD-10-CM | POA: Diagnosis not present

## 2015-04-09 DIAGNOSIS — I519 Heart disease, unspecified: Secondary | ICD-10-CM | POA: Diagnosis not present

## 2015-04-09 DIAGNOSIS — I69351 Hemiplegia and hemiparesis following cerebral infarction affecting right dominant side: Secondary | ICD-10-CM | POA: Diagnosis not present

## 2015-04-09 DIAGNOSIS — J449 Chronic obstructive pulmonary disease, unspecified: Secondary | ICD-10-CM | POA: Diagnosis not present

## 2015-04-09 DIAGNOSIS — S52202D Unspecified fracture of shaft of left ulna, subsequent encounter for closed fracture with routine healing: Secondary | ICD-10-CM | POA: Diagnosis not present

## 2015-04-10 DIAGNOSIS — I519 Heart disease, unspecified: Secondary | ICD-10-CM | POA: Diagnosis not present

## 2015-04-10 DIAGNOSIS — I69351 Hemiplegia and hemiparesis following cerebral infarction affecting right dominant side: Secondary | ICD-10-CM | POA: Diagnosis not present

## 2015-04-10 DIAGNOSIS — D649 Anemia, unspecified: Secondary | ICD-10-CM | POA: Diagnosis not present

## 2015-04-10 DIAGNOSIS — J9611 Chronic respiratory failure with hypoxia: Secondary | ICD-10-CM | POA: Diagnosis not present

## 2015-04-10 DIAGNOSIS — Z79891 Long term (current) use of opiate analgesic: Secondary | ICD-10-CM | POA: Diagnosis not present

## 2015-04-10 DIAGNOSIS — I130 Hypertensive heart and chronic kidney disease with heart failure and stage 1 through stage 4 chronic kidney disease, or unspecified chronic kidney disease: Secondary | ICD-10-CM | POA: Diagnosis not present

## 2015-04-10 DIAGNOSIS — F17200 Nicotine dependence, unspecified, uncomplicated: Secondary | ICD-10-CM | POA: Diagnosis not present

## 2015-04-10 DIAGNOSIS — F419 Anxiety disorder, unspecified: Secondary | ICD-10-CM | POA: Diagnosis not present

## 2015-04-10 DIAGNOSIS — S52502D Unspecified fracture of the lower end of left radius, subsequent encounter for closed fracture with routine healing: Secondary | ICD-10-CM | POA: Diagnosis not present

## 2015-04-10 DIAGNOSIS — J449 Chronic obstructive pulmonary disease, unspecified: Secondary | ICD-10-CM | POA: Diagnosis not present

## 2015-04-10 DIAGNOSIS — N189 Chronic kidney disease, unspecified: Secondary | ICD-10-CM | POA: Diagnosis not present

## 2015-04-10 DIAGNOSIS — Z9981 Dependence on supplemental oxygen: Secondary | ICD-10-CM | POA: Diagnosis not present

## 2015-04-10 DIAGNOSIS — S52202D Unspecified fracture of shaft of left ulna, subsequent encounter for closed fracture with routine healing: Secondary | ICD-10-CM | POA: Diagnosis not present

## 2015-04-13 DIAGNOSIS — Z79891 Long term (current) use of opiate analgesic: Secondary | ICD-10-CM | POA: Diagnosis not present

## 2015-04-13 DIAGNOSIS — Z9981 Dependence on supplemental oxygen: Secondary | ICD-10-CM | POA: Diagnosis not present

## 2015-04-13 DIAGNOSIS — F419 Anxiety disorder, unspecified: Secondary | ICD-10-CM | POA: Diagnosis not present

## 2015-04-13 DIAGNOSIS — D649 Anemia, unspecified: Secondary | ICD-10-CM | POA: Diagnosis not present

## 2015-04-13 DIAGNOSIS — N189 Chronic kidney disease, unspecified: Secondary | ICD-10-CM | POA: Diagnosis not present

## 2015-04-13 DIAGNOSIS — S52202D Unspecified fracture of shaft of left ulna, subsequent encounter for closed fracture with routine healing: Secondary | ICD-10-CM | POA: Diagnosis not present

## 2015-04-13 DIAGNOSIS — J449 Chronic obstructive pulmonary disease, unspecified: Secondary | ICD-10-CM | POA: Diagnosis not present

## 2015-04-13 DIAGNOSIS — F17200 Nicotine dependence, unspecified, uncomplicated: Secondary | ICD-10-CM | POA: Diagnosis not present

## 2015-04-13 DIAGNOSIS — I69351 Hemiplegia and hemiparesis following cerebral infarction affecting right dominant side: Secondary | ICD-10-CM | POA: Diagnosis not present

## 2015-04-13 DIAGNOSIS — J9611 Chronic respiratory failure with hypoxia: Secondary | ICD-10-CM | POA: Diagnosis not present

## 2015-04-13 DIAGNOSIS — I130 Hypertensive heart and chronic kidney disease with heart failure and stage 1 through stage 4 chronic kidney disease, or unspecified chronic kidney disease: Secondary | ICD-10-CM | POA: Diagnosis not present

## 2015-04-13 DIAGNOSIS — S52502D Unspecified fracture of the lower end of left radius, subsequent encounter for closed fracture with routine healing: Secondary | ICD-10-CM | POA: Diagnosis not present

## 2015-04-13 DIAGNOSIS — I519 Heart disease, unspecified: Secondary | ICD-10-CM | POA: Diagnosis not present

## 2015-04-14 DIAGNOSIS — D649 Anemia, unspecified: Secondary | ICD-10-CM | POA: Diagnosis not present

## 2015-04-14 DIAGNOSIS — I69351 Hemiplegia and hemiparesis following cerebral infarction affecting right dominant side: Secondary | ICD-10-CM | POA: Diagnosis not present

## 2015-04-14 DIAGNOSIS — N189 Chronic kidney disease, unspecified: Secondary | ICD-10-CM | POA: Diagnosis not present

## 2015-04-14 DIAGNOSIS — F419 Anxiety disorder, unspecified: Secondary | ICD-10-CM | POA: Diagnosis not present

## 2015-04-14 DIAGNOSIS — S52502D Unspecified fracture of the lower end of left radius, subsequent encounter for closed fracture with routine healing: Secondary | ICD-10-CM | POA: Diagnosis not present

## 2015-04-14 DIAGNOSIS — S52202D Unspecified fracture of shaft of left ulna, subsequent encounter for closed fracture with routine healing: Secondary | ICD-10-CM | POA: Diagnosis not present

## 2015-04-14 DIAGNOSIS — Z79891 Long term (current) use of opiate analgesic: Secondary | ICD-10-CM | POA: Diagnosis not present

## 2015-04-14 DIAGNOSIS — S52502A Unspecified fracture of the lower end of left radius, initial encounter for closed fracture: Secondary | ICD-10-CM | POA: Diagnosis not present

## 2015-04-14 DIAGNOSIS — Z9981 Dependence on supplemental oxygen: Secondary | ICD-10-CM | POA: Diagnosis not present

## 2015-04-14 DIAGNOSIS — I130 Hypertensive heart and chronic kidney disease with heart failure and stage 1 through stage 4 chronic kidney disease, or unspecified chronic kidney disease: Secondary | ICD-10-CM | POA: Diagnosis not present

## 2015-04-14 DIAGNOSIS — F17200 Nicotine dependence, unspecified, uncomplicated: Secondary | ICD-10-CM | POA: Diagnosis not present

## 2015-04-14 DIAGNOSIS — J449 Chronic obstructive pulmonary disease, unspecified: Secondary | ICD-10-CM | POA: Diagnosis not present

## 2015-04-14 DIAGNOSIS — I519 Heart disease, unspecified: Secondary | ICD-10-CM | POA: Diagnosis not present

## 2015-04-14 DIAGNOSIS — J9611 Chronic respiratory failure with hypoxia: Secondary | ICD-10-CM | POA: Diagnosis not present

## 2015-04-15 DIAGNOSIS — I6990 Unspecified sequelae of unspecified cerebrovascular disease: Secondary | ICD-10-CM | POA: Diagnosis not present

## 2015-04-15 DIAGNOSIS — I671 Cerebral aneurysm, nonruptured: Secondary | ICD-10-CM | POA: Diagnosis not present

## 2015-04-15 DIAGNOSIS — M1990 Unspecified osteoarthritis, unspecified site: Secondary | ICD-10-CM | POA: Diagnosis not present

## 2015-04-15 DIAGNOSIS — Z6826 Body mass index (BMI) 26.0-26.9, adult: Secondary | ICD-10-CM | POA: Diagnosis not present

## 2015-04-15 DIAGNOSIS — N185 Chronic kidney disease, stage 5: Secondary | ICD-10-CM | POA: Diagnosis not present

## 2015-04-16 DIAGNOSIS — I130 Hypertensive heart and chronic kidney disease with heart failure and stage 1 through stage 4 chronic kidney disease, or unspecified chronic kidney disease: Secondary | ICD-10-CM | POA: Diagnosis not present

## 2015-04-16 DIAGNOSIS — F419 Anxiety disorder, unspecified: Secondary | ICD-10-CM | POA: Diagnosis not present

## 2015-04-16 DIAGNOSIS — J449 Chronic obstructive pulmonary disease, unspecified: Secondary | ICD-10-CM | POA: Diagnosis not present

## 2015-04-16 DIAGNOSIS — N189 Chronic kidney disease, unspecified: Secondary | ICD-10-CM | POA: Diagnosis not present

## 2015-04-16 DIAGNOSIS — S52202D Unspecified fracture of shaft of left ulna, subsequent encounter for closed fracture with routine healing: Secondary | ICD-10-CM | POA: Diagnosis not present

## 2015-04-16 DIAGNOSIS — Z79891 Long term (current) use of opiate analgesic: Secondary | ICD-10-CM | POA: Diagnosis not present

## 2015-04-16 DIAGNOSIS — Z9981 Dependence on supplemental oxygen: Secondary | ICD-10-CM | POA: Diagnosis not present

## 2015-04-16 DIAGNOSIS — J9611 Chronic respiratory failure with hypoxia: Secondary | ICD-10-CM | POA: Diagnosis not present

## 2015-04-16 DIAGNOSIS — I69351 Hemiplegia and hemiparesis following cerebral infarction affecting right dominant side: Secondary | ICD-10-CM | POA: Diagnosis not present

## 2015-04-16 DIAGNOSIS — I519 Heart disease, unspecified: Secondary | ICD-10-CM | POA: Diagnosis not present

## 2015-04-16 DIAGNOSIS — F17200 Nicotine dependence, unspecified, uncomplicated: Secondary | ICD-10-CM | POA: Diagnosis not present

## 2015-04-16 DIAGNOSIS — D649 Anemia, unspecified: Secondary | ICD-10-CM | POA: Diagnosis not present

## 2015-04-16 DIAGNOSIS — S52502D Unspecified fracture of the lower end of left radius, subsequent encounter for closed fracture with routine healing: Secondary | ICD-10-CM | POA: Diagnosis not present

## 2015-04-17 DIAGNOSIS — I130 Hypertensive heart and chronic kidney disease with heart failure and stage 1 through stage 4 chronic kidney disease, or unspecified chronic kidney disease: Secondary | ICD-10-CM | POA: Diagnosis not present

## 2015-04-17 DIAGNOSIS — J9611 Chronic respiratory failure with hypoxia: Secondary | ICD-10-CM | POA: Diagnosis not present

## 2015-04-17 DIAGNOSIS — I519 Heart disease, unspecified: Secondary | ICD-10-CM | POA: Diagnosis not present

## 2015-04-17 DIAGNOSIS — I69351 Hemiplegia and hemiparesis following cerebral infarction affecting right dominant side: Secondary | ICD-10-CM | POA: Diagnosis not present

## 2015-04-17 DIAGNOSIS — Z9981 Dependence on supplemental oxygen: Secondary | ICD-10-CM | POA: Diagnosis not present

## 2015-04-17 DIAGNOSIS — F419 Anxiety disorder, unspecified: Secondary | ICD-10-CM | POA: Diagnosis not present

## 2015-04-17 DIAGNOSIS — S52502D Unspecified fracture of the lower end of left radius, subsequent encounter for closed fracture with routine healing: Secondary | ICD-10-CM | POA: Diagnosis not present

## 2015-04-17 DIAGNOSIS — D649 Anemia, unspecified: Secondary | ICD-10-CM | POA: Diagnosis not present

## 2015-04-17 DIAGNOSIS — Z79891 Long term (current) use of opiate analgesic: Secondary | ICD-10-CM | POA: Diagnosis not present

## 2015-04-17 DIAGNOSIS — N189 Chronic kidney disease, unspecified: Secondary | ICD-10-CM | POA: Diagnosis not present

## 2015-04-17 DIAGNOSIS — F17200 Nicotine dependence, unspecified, uncomplicated: Secondary | ICD-10-CM | POA: Diagnosis not present

## 2015-04-17 DIAGNOSIS — J449 Chronic obstructive pulmonary disease, unspecified: Secondary | ICD-10-CM | POA: Diagnosis not present

## 2015-04-17 DIAGNOSIS — S52202D Unspecified fracture of shaft of left ulna, subsequent encounter for closed fracture with routine healing: Secondary | ICD-10-CM | POA: Diagnosis not present

## 2015-04-20 DIAGNOSIS — I519 Heart disease, unspecified: Secondary | ICD-10-CM | POA: Diagnosis not present

## 2015-04-20 DIAGNOSIS — I130 Hypertensive heart and chronic kidney disease with heart failure and stage 1 through stage 4 chronic kidney disease, or unspecified chronic kidney disease: Secondary | ICD-10-CM | POA: Diagnosis not present

## 2015-04-20 DIAGNOSIS — J449 Chronic obstructive pulmonary disease, unspecified: Secondary | ICD-10-CM | POA: Diagnosis not present

## 2015-04-20 DIAGNOSIS — N189 Chronic kidney disease, unspecified: Secondary | ICD-10-CM | POA: Diagnosis not present

## 2015-04-20 DIAGNOSIS — I69351 Hemiplegia and hemiparesis following cerebral infarction affecting right dominant side: Secondary | ICD-10-CM | POA: Diagnosis not present

## 2015-04-20 DIAGNOSIS — J9611 Chronic respiratory failure with hypoxia: Secondary | ICD-10-CM | POA: Diagnosis not present

## 2015-04-20 DIAGNOSIS — S52502D Unspecified fracture of the lower end of left radius, subsequent encounter for closed fracture with routine healing: Secondary | ICD-10-CM | POA: Diagnosis not present

## 2015-04-20 DIAGNOSIS — F17200 Nicotine dependence, unspecified, uncomplicated: Secondary | ICD-10-CM | POA: Diagnosis not present

## 2015-04-20 DIAGNOSIS — S52202D Unspecified fracture of shaft of left ulna, subsequent encounter for closed fracture with routine healing: Secondary | ICD-10-CM | POA: Diagnosis not present

## 2015-04-20 DIAGNOSIS — Z79891 Long term (current) use of opiate analgesic: Secondary | ICD-10-CM | POA: Diagnosis not present

## 2015-04-20 DIAGNOSIS — Z9981 Dependence on supplemental oxygen: Secondary | ICD-10-CM | POA: Diagnosis not present

## 2015-04-20 DIAGNOSIS — F419 Anxiety disorder, unspecified: Secondary | ICD-10-CM | POA: Diagnosis not present

## 2015-04-20 DIAGNOSIS — D649 Anemia, unspecified: Secondary | ICD-10-CM | POA: Diagnosis not present

## 2015-04-22 DIAGNOSIS — J449 Chronic obstructive pulmonary disease, unspecified: Secondary | ICD-10-CM | POA: Diagnosis not present

## 2015-04-22 DIAGNOSIS — I519 Heart disease, unspecified: Secondary | ICD-10-CM | POA: Diagnosis not present

## 2015-04-22 DIAGNOSIS — J9611 Chronic respiratory failure with hypoxia: Secondary | ICD-10-CM | POA: Diagnosis not present

## 2015-04-22 DIAGNOSIS — F419 Anxiety disorder, unspecified: Secondary | ICD-10-CM | POA: Diagnosis not present

## 2015-04-22 DIAGNOSIS — S52202D Unspecified fracture of shaft of left ulna, subsequent encounter for closed fracture with routine healing: Secondary | ICD-10-CM | POA: Diagnosis not present

## 2015-04-22 DIAGNOSIS — N189 Chronic kidney disease, unspecified: Secondary | ICD-10-CM | POA: Diagnosis not present

## 2015-04-22 DIAGNOSIS — S52502D Unspecified fracture of the lower end of left radius, subsequent encounter for closed fracture with routine healing: Secondary | ICD-10-CM | POA: Diagnosis not present

## 2015-04-22 DIAGNOSIS — I69351 Hemiplegia and hemiparesis following cerebral infarction affecting right dominant side: Secondary | ICD-10-CM | POA: Diagnosis not present

## 2015-04-22 DIAGNOSIS — D649 Anemia, unspecified: Secondary | ICD-10-CM | POA: Diagnosis not present

## 2015-04-22 DIAGNOSIS — Z79891 Long term (current) use of opiate analgesic: Secondary | ICD-10-CM | POA: Diagnosis not present

## 2015-04-22 DIAGNOSIS — F17200 Nicotine dependence, unspecified, uncomplicated: Secondary | ICD-10-CM | POA: Diagnosis not present

## 2015-04-22 DIAGNOSIS — Z9981 Dependence on supplemental oxygen: Secondary | ICD-10-CM | POA: Diagnosis not present

## 2015-04-22 DIAGNOSIS — I130 Hypertensive heart and chronic kidney disease with heart failure and stage 1 through stage 4 chronic kidney disease, or unspecified chronic kidney disease: Secondary | ICD-10-CM | POA: Diagnosis not present

## 2015-04-24 DIAGNOSIS — J449 Chronic obstructive pulmonary disease, unspecified: Secondary | ICD-10-CM | POA: Diagnosis not present

## 2015-04-24 DIAGNOSIS — I69351 Hemiplegia and hemiparesis following cerebral infarction affecting right dominant side: Secondary | ICD-10-CM | POA: Diagnosis not present

## 2015-04-24 DIAGNOSIS — I519 Heart disease, unspecified: Secondary | ICD-10-CM | POA: Diagnosis not present

## 2015-04-24 DIAGNOSIS — S52502D Unspecified fracture of the lower end of left radius, subsequent encounter for closed fracture with routine healing: Secondary | ICD-10-CM | POA: Diagnosis not present

## 2015-04-24 DIAGNOSIS — F17200 Nicotine dependence, unspecified, uncomplicated: Secondary | ICD-10-CM | POA: Diagnosis not present

## 2015-04-24 DIAGNOSIS — S52202D Unspecified fracture of shaft of left ulna, subsequent encounter for closed fracture with routine healing: Secondary | ICD-10-CM | POA: Diagnosis not present

## 2015-04-24 DIAGNOSIS — Z9981 Dependence on supplemental oxygen: Secondary | ICD-10-CM | POA: Diagnosis not present

## 2015-04-24 DIAGNOSIS — I130 Hypertensive heart and chronic kidney disease with heart failure and stage 1 through stage 4 chronic kidney disease, or unspecified chronic kidney disease: Secondary | ICD-10-CM | POA: Diagnosis not present

## 2015-04-24 DIAGNOSIS — J9611 Chronic respiratory failure with hypoxia: Secondary | ICD-10-CM | POA: Diagnosis not present

## 2015-04-24 DIAGNOSIS — Z79891 Long term (current) use of opiate analgesic: Secondary | ICD-10-CM | POA: Diagnosis not present

## 2015-04-24 DIAGNOSIS — F419 Anxiety disorder, unspecified: Secondary | ICD-10-CM | POA: Diagnosis not present

## 2015-04-24 DIAGNOSIS — N189 Chronic kidney disease, unspecified: Secondary | ICD-10-CM | POA: Diagnosis not present

## 2015-04-24 DIAGNOSIS — D649 Anemia, unspecified: Secondary | ICD-10-CM | POA: Diagnosis not present

## 2015-04-27 DIAGNOSIS — S52502D Unspecified fracture of the lower end of left radius, subsequent encounter for closed fracture with routine healing: Secondary | ICD-10-CM | POA: Diagnosis not present

## 2015-04-27 DIAGNOSIS — I69351 Hemiplegia and hemiparesis following cerebral infarction affecting right dominant side: Secondary | ICD-10-CM | POA: Diagnosis not present

## 2015-04-27 DIAGNOSIS — S52202D Unspecified fracture of shaft of left ulna, subsequent encounter for closed fracture with routine healing: Secondary | ICD-10-CM | POA: Diagnosis not present

## 2015-04-27 DIAGNOSIS — J9611 Chronic respiratory failure with hypoxia: Secondary | ICD-10-CM | POA: Diagnosis not present

## 2015-04-30 DIAGNOSIS — I69351 Hemiplegia and hemiparesis following cerebral infarction affecting right dominant side: Secondary | ICD-10-CM | POA: Diagnosis not present

## 2015-04-30 DIAGNOSIS — J9611 Chronic respiratory failure with hypoxia: Secondary | ICD-10-CM | POA: Diagnosis not present

## 2015-04-30 DIAGNOSIS — S52202D Unspecified fracture of shaft of left ulna, subsequent encounter for closed fracture with routine healing: Secondary | ICD-10-CM | POA: Diagnosis not present

## 2015-04-30 DIAGNOSIS — S52502D Unspecified fracture of the lower end of left radius, subsequent encounter for closed fracture with routine healing: Secondary | ICD-10-CM | POA: Diagnosis not present

## 2015-05-05 DIAGNOSIS — J9610 Chronic respiratory failure, unspecified whether with hypoxia or hypercapnia: Secondary | ICD-10-CM | POA: Diagnosis not present

## 2015-05-05 DIAGNOSIS — S52202D Unspecified fracture of shaft of left ulna, subsequent encounter for closed fracture with routine healing: Secondary | ICD-10-CM | POA: Diagnosis not present

## 2015-05-05 DIAGNOSIS — S52502D Unspecified fracture of the lower end of left radius, subsequent encounter for closed fracture with routine healing: Secondary | ICD-10-CM | POA: Diagnosis not present

## 2015-05-05 DIAGNOSIS — I69351 Hemiplegia and hemiparesis following cerebral infarction affecting right dominant side: Secondary | ICD-10-CM | POA: Diagnosis not present

## 2015-05-05 DIAGNOSIS — J9611 Chronic respiratory failure with hypoxia: Secondary | ICD-10-CM | POA: Diagnosis not present

## 2015-05-06 DIAGNOSIS — S52202D Unspecified fracture of shaft of left ulna, subsequent encounter for closed fracture with routine healing: Secondary | ICD-10-CM | POA: Diagnosis not present

## 2015-05-06 DIAGNOSIS — J9611 Chronic respiratory failure with hypoxia: Secondary | ICD-10-CM | POA: Diagnosis not present

## 2015-05-06 DIAGNOSIS — I69351 Hemiplegia and hemiparesis following cerebral infarction affecting right dominant side: Secondary | ICD-10-CM | POA: Diagnosis not present

## 2015-05-06 DIAGNOSIS — S52502D Unspecified fracture of the lower end of left radius, subsequent encounter for closed fracture with routine healing: Secondary | ICD-10-CM | POA: Diagnosis not present

## 2015-05-07 DIAGNOSIS — J9611 Chronic respiratory failure with hypoxia: Secondary | ICD-10-CM | POA: Diagnosis not present

## 2015-05-07 DIAGNOSIS — S52502D Unspecified fracture of the lower end of left radius, subsequent encounter for closed fracture with routine healing: Secondary | ICD-10-CM | POA: Diagnosis not present

## 2015-05-07 DIAGNOSIS — S52202D Unspecified fracture of shaft of left ulna, subsequent encounter for closed fracture with routine healing: Secondary | ICD-10-CM | POA: Diagnosis not present

## 2015-05-07 DIAGNOSIS — I69351 Hemiplegia and hemiparesis following cerebral infarction affecting right dominant side: Secondary | ICD-10-CM | POA: Diagnosis not present

## 2015-05-08 DIAGNOSIS — S52502A Unspecified fracture of the lower end of left radius, initial encounter for closed fracture: Secondary | ICD-10-CM | POA: Diagnosis not present

## 2015-05-12 DIAGNOSIS — J9611 Chronic respiratory failure with hypoxia: Secondary | ICD-10-CM | POA: Diagnosis not present

## 2015-05-12 DIAGNOSIS — S52202D Unspecified fracture of shaft of left ulna, subsequent encounter for closed fracture with routine healing: Secondary | ICD-10-CM | POA: Diagnosis not present

## 2015-05-12 DIAGNOSIS — S52502D Unspecified fracture of the lower end of left radius, subsequent encounter for closed fracture with routine healing: Secondary | ICD-10-CM | POA: Diagnosis not present

## 2015-05-12 DIAGNOSIS — I69351 Hemiplegia and hemiparesis following cerebral infarction affecting right dominant side: Secondary | ICD-10-CM | POA: Diagnosis not present

## 2015-05-13 DIAGNOSIS — I69351 Hemiplegia and hemiparesis following cerebral infarction affecting right dominant side: Secondary | ICD-10-CM | POA: Diagnosis not present

## 2015-05-13 DIAGNOSIS — S52202D Unspecified fracture of shaft of left ulna, subsequent encounter for closed fracture with routine healing: Secondary | ICD-10-CM | POA: Diagnosis not present

## 2015-05-13 DIAGNOSIS — J9611 Chronic respiratory failure with hypoxia: Secondary | ICD-10-CM | POA: Diagnosis not present

## 2015-05-13 DIAGNOSIS — S52502D Unspecified fracture of the lower end of left radius, subsequent encounter for closed fracture with routine healing: Secondary | ICD-10-CM | POA: Diagnosis not present

## 2015-05-14 DIAGNOSIS — I69351 Hemiplegia and hemiparesis following cerebral infarction affecting right dominant side: Secondary | ICD-10-CM | POA: Diagnosis not present

## 2015-05-14 DIAGNOSIS — J9611 Chronic respiratory failure with hypoxia: Secondary | ICD-10-CM | POA: Diagnosis not present

## 2015-05-14 DIAGNOSIS — S52502D Unspecified fracture of the lower end of left radius, subsequent encounter for closed fracture with routine healing: Secondary | ICD-10-CM | POA: Diagnosis not present

## 2015-05-14 DIAGNOSIS — S52202D Unspecified fracture of shaft of left ulna, subsequent encounter for closed fracture with routine healing: Secondary | ICD-10-CM | POA: Diagnosis not present

## 2015-05-18 DIAGNOSIS — S52502D Unspecified fracture of the lower end of left radius, subsequent encounter for closed fracture with routine healing: Secondary | ICD-10-CM | POA: Diagnosis not present

## 2015-05-18 DIAGNOSIS — J9611 Chronic respiratory failure with hypoxia: Secondary | ICD-10-CM | POA: Diagnosis not present

## 2015-05-18 DIAGNOSIS — S52202D Unspecified fracture of shaft of left ulna, subsequent encounter for closed fracture with routine healing: Secondary | ICD-10-CM | POA: Diagnosis not present

## 2015-05-18 DIAGNOSIS — I69351 Hemiplegia and hemiparesis following cerebral infarction affecting right dominant side: Secondary | ICD-10-CM | POA: Diagnosis not present

## 2015-05-19 DIAGNOSIS — N2581 Secondary hyperparathyroidism of renal origin: Secondary | ICD-10-CM | POA: Diagnosis not present

## 2015-05-19 DIAGNOSIS — I120 Hypertensive chronic kidney disease with stage 5 chronic kidney disease or end stage renal disease: Secondary | ICD-10-CM | POA: Diagnosis not present

## 2015-05-19 DIAGNOSIS — M109 Gout, unspecified: Secondary | ICD-10-CM | POA: Diagnosis not present

## 2015-05-19 DIAGNOSIS — D631 Anemia in chronic kidney disease: Secondary | ICD-10-CM | POA: Diagnosis not present

## 2015-05-19 DIAGNOSIS — N185 Chronic kidney disease, stage 5: Secondary | ICD-10-CM | POA: Diagnosis not present

## 2015-05-21 DIAGNOSIS — J9611 Chronic respiratory failure with hypoxia: Secondary | ICD-10-CM | POA: Diagnosis not present

## 2015-05-21 DIAGNOSIS — S52202D Unspecified fracture of shaft of left ulna, subsequent encounter for closed fracture with routine healing: Secondary | ICD-10-CM | POA: Diagnosis not present

## 2015-05-21 DIAGNOSIS — S52502D Unspecified fracture of the lower end of left radius, subsequent encounter for closed fracture with routine healing: Secondary | ICD-10-CM | POA: Diagnosis not present

## 2015-05-21 DIAGNOSIS — I69351 Hemiplegia and hemiparesis following cerebral infarction affecting right dominant side: Secondary | ICD-10-CM | POA: Diagnosis not present

## 2015-05-24 DIAGNOSIS — J811 Chronic pulmonary edema: Secondary | ICD-10-CM | POA: Diagnosis not present

## 2015-05-24 DIAGNOSIS — J189 Pneumonia, unspecified organism: Secondary | ICD-10-CM | POA: Diagnosis not present

## 2015-05-24 DIAGNOSIS — E860 Dehydration: Secondary | ICD-10-CM | POA: Diagnosis not present

## 2015-05-24 DIAGNOSIS — J181 Lobar pneumonia, unspecified organism: Secondary | ICD-10-CM | POA: Diagnosis not present

## 2015-05-24 DIAGNOSIS — R319 Hematuria, unspecified: Secondary | ICD-10-CM | POA: Diagnosis not present

## 2015-05-24 DIAGNOSIS — R112 Nausea with vomiting, unspecified: Secondary | ICD-10-CM | POA: Diagnosis not present

## 2015-05-27 DIAGNOSIS — I639 Cerebral infarction, unspecified: Secondary | ICD-10-CM | POA: Diagnosis not present

## 2015-05-27 DIAGNOSIS — N179 Acute kidney failure, unspecified: Secondary | ICD-10-CM | POA: Diagnosis not present

## 2015-05-27 DIAGNOSIS — M109 Gout, unspecified: Secondary | ICD-10-CM | POA: Diagnosis not present

## 2015-05-27 DIAGNOSIS — Z9981 Dependence on supplemental oxygen: Secondary | ICD-10-CM | POA: Diagnosis not present

## 2015-05-27 DIAGNOSIS — Z7982 Long term (current) use of aspirin: Secondary | ICD-10-CM | POA: Diagnosis not present

## 2015-05-27 DIAGNOSIS — Z8673 Personal history of transient ischemic attack (TIA), and cerebral infarction without residual deficits: Secondary | ICD-10-CM | POA: Diagnosis not present

## 2015-05-27 DIAGNOSIS — R531 Weakness: Secondary | ICD-10-CM | POA: Diagnosis not present

## 2015-05-27 DIAGNOSIS — J9811 Atelectasis: Secondary | ICD-10-CM | POA: Diagnosis not present

## 2015-05-27 DIAGNOSIS — M1990 Unspecified osteoarthritis, unspecified site: Secondary | ICD-10-CM | POA: Diagnosis not present

## 2015-05-27 DIAGNOSIS — Z853 Personal history of malignant neoplasm of breast: Secondary | ICD-10-CM | POA: Diagnosis not present

## 2015-05-27 DIAGNOSIS — R262 Difficulty in walking, not elsewhere classified: Secondary | ICD-10-CM | POA: Diagnosis not present

## 2015-05-27 DIAGNOSIS — J189 Pneumonia, unspecified organism: Secondary | ICD-10-CM | POA: Diagnosis not present

## 2015-05-27 DIAGNOSIS — Z7401 Bed confinement status: Secondary | ICD-10-CM | POA: Diagnosis not present

## 2015-05-27 DIAGNOSIS — D493 Neoplasm of unspecified behavior of breast: Secondary | ICD-10-CM | POA: Diagnosis not present

## 2015-05-27 DIAGNOSIS — E860 Dehydration: Secondary | ICD-10-CM | POA: Diagnosis not present

## 2015-05-27 DIAGNOSIS — E119 Type 2 diabetes mellitus without complications: Secondary | ICD-10-CM | POA: Diagnosis not present

## 2015-05-27 DIAGNOSIS — G92 Toxic encephalopathy: Secondary | ICD-10-CM | POA: Diagnosis not present

## 2015-05-27 DIAGNOSIS — E039 Hypothyroidism, unspecified: Secondary | ICD-10-CM | POA: Diagnosis not present

## 2015-05-27 DIAGNOSIS — K5730 Diverticulosis of large intestine without perforation or abscess without bleeding: Secondary | ICD-10-CM | POA: Diagnosis not present

## 2015-05-27 DIAGNOSIS — R488 Other symbolic dysfunctions: Secondary | ICD-10-CM | POA: Diagnosis not present

## 2015-05-27 DIAGNOSIS — R7989 Other specified abnormal findings of blood chemistry: Secondary | ICD-10-CM | POA: Diagnosis not present

## 2015-05-27 DIAGNOSIS — Z9049 Acquired absence of other specified parts of digestive tract: Secondary | ICD-10-CM | POA: Diagnosis not present

## 2015-05-27 DIAGNOSIS — I69998 Other sequelae following unspecified cerebrovascular disease: Secondary | ICD-10-CM | POA: Diagnosis not present

## 2015-05-27 DIAGNOSIS — I6789 Other cerebrovascular disease: Secondary | ICD-10-CM | POA: Diagnosis not present

## 2015-05-27 DIAGNOSIS — I2510 Atherosclerotic heart disease of native coronary artery without angina pectoris: Secondary | ICD-10-CM | POA: Diagnosis not present

## 2015-05-27 DIAGNOSIS — Z741 Need for assistance with personal care: Secondary | ICD-10-CM | POA: Diagnosis not present

## 2015-05-27 DIAGNOSIS — J439 Emphysema, unspecified: Secondary | ICD-10-CM | POA: Diagnosis not present

## 2015-05-27 DIAGNOSIS — I38 Endocarditis, valve unspecified: Secondary | ICD-10-CM | POA: Diagnosis not present

## 2015-05-27 DIAGNOSIS — I6990 Unspecified sequelae of unspecified cerebrovascular disease: Secondary | ICD-10-CM | POA: Diagnosis not present

## 2015-05-27 DIAGNOSIS — M810 Age-related osteoporosis without current pathological fracture: Secondary | ICD-10-CM | POA: Diagnosis not present

## 2015-05-27 DIAGNOSIS — Z9011 Acquired absence of right breast and nipple: Secondary | ICD-10-CM | POA: Diagnosis not present

## 2015-05-27 DIAGNOSIS — Z87891 Personal history of nicotine dependence: Secondary | ICD-10-CM | POA: Diagnosis not present

## 2015-05-27 DIAGNOSIS — I129 Hypertensive chronic kidney disease with stage 1 through stage 4 chronic kidney disease, or unspecified chronic kidney disease: Secondary | ICD-10-CM | POA: Diagnosis not present

## 2015-05-27 DIAGNOSIS — A419 Sepsis, unspecified organism: Secondary | ICD-10-CM | POA: Diagnosis not present

## 2015-05-27 DIAGNOSIS — R6520 Severe sepsis without septic shock: Secondary | ICD-10-CM | POA: Diagnosis not present

## 2015-05-27 DIAGNOSIS — K219 Gastro-esophageal reflux disease without esophagitis: Secondary | ICD-10-CM | POA: Diagnosis not present

## 2015-05-27 DIAGNOSIS — J449 Chronic obstructive pulmonary disease, unspecified: Secondary | ICD-10-CM | POA: Diagnosis not present

## 2015-05-27 DIAGNOSIS — N183 Chronic kidney disease, stage 3 (moderate): Secondary | ICD-10-CM | POA: Diagnosis not present

## 2015-05-27 DIAGNOSIS — E7800 Pure hypercholesterolemia, unspecified: Secondary | ICD-10-CM | POA: Diagnosis not present

## 2015-06-01 DIAGNOSIS — Z7401 Bed confinement status: Secondary | ICD-10-CM | POA: Diagnosis not present

## 2015-06-01 DIAGNOSIS — K5730 Diverticulosis of large intestine without perforation or abscess without bleeding: Secondary | ICD-10-CM | POA: Diagnosis not present

## 2015-06-01 DIAGNOSIS — R488 Other symbolic dysfunctions: Secondary | ICD-10-CM | POA: Diagnosis not present

## 2015-06-01 DIAGNOSIS — R262 Difficulty in walking, not elsewhere classified: Secondary | ICD-10-CM | POA: Diagnosis not present

## 2015-06-01 DIAGNOSIS — I38 Endocarditis, valve unspecified: Secondary | ICD-10-CM | POA: Diagnosis not present

## 2015-06-01 DIAGNOSIS — I69998 Other sequelae following unspecified cerebrovascular disease: Secondary | ICD-10-CM | POA: Diagnosis not present

## 2015-06-01 DIAGNOSIS — E039 Hypothyroidism, unspecified: Secondary | ICD-10-CM | POA: Diagnosis not present

## 2015-06-01 DIAGNOSIS — K219 Gastro-esophageal reflux disease without esophagitis: Secondary | ICD-10-CM | POA: Diagnosis not present

## 2015-06-01 DIAGNOSIS — Z741 Need for assistance with personal care: Secondary | ICD-10-CM | POA: Diagnosis not present

## 2015-06-01 DIAGNOSIS — J449 Chronic obstructive pulmonary disease, unspecified: Secondary | ICD-10-CM | POA: Diagnosis not present

## 2015-06-01 DIAGNOSIS — M1990 Unspecified osteoarthritis, unspecified site: Secondary | ICD-10-CM | POA: Diagnosis not present

## 2015-06-01 DIAGNOSIS — M810 Age-related osteoporosis without current pathological fracture: Secondary | ICD-10-CM | POA: Diagnosis not present

## 2015-06-01 DIAGNOSIS — N179 Acute kidney failure, unspecified: Secondary | ICD-10-CM | POA: Diagnosis not present

## 2015-06-01 DIAGNOSIS — I6990 Unspecified sequelae of unspecified cerebrovascular disease: Secondary | ICD-10-CM | POA: Diagnosis not present

## 2015-06-01 DIAGNOSIS — J9610 Chronic respiratory failure, unspecified whether with hypoxia or hypercapnia: Secondary | ICD-10-CM | POA: Diagnosis not present

## 2015-06-01 DIAGNOSIS — E7800 Pure hypercholesterolemia, unspecified: Secondary | ICD-10-CM | POA: Diagnosis not present

## 2015-06-01 DIAGNOSIS — I129 Hypertensive chronic kidney disease with stage 1 through stage 4 chronic kidney disease, or unspecified chronic kidney disease: Secondary | ICD-10-CM | POA: Diagnosis not present

## 2015-06-01 DIAGNOSIS — J9621 Acute and chronic respiratory failure with hypoxia: Secondary | ICD-10-CM | POA: Diagnosis not present

## 2015-06-01 DIAGNOSIS — M109 Gout, unspecified: Secondary | ICD-10-CM | POA: Diagnosis not present

## 2015-06-01 DIAGNOSIS — E119 Type 2 diabetes mellitus without complications: Secondary | ICD-10-CM | POA: Diagnosis not present

## 2015-06-01 DIAGNOSIS — N183 Chronic kidney disease, stage 3 (moderate): Secondary | ICD-10-CM | POA: Diagnosis not present

## 2015-06-01 DIAGNOSIS — I639 Cerebral infarction, unspecified: Secondary | ICD-10-CM | POA: Diagnosis not present

## 2015-06-01 DIAGNOSIS — I2510 Atherosclerotic heart disease of native coronary artery without angina pectoris: Secondary | ICD-10-CM | POA: Diagnosis not present

## 2015-06-01 DIAGNOSIS — Z9981 Dependence on supplemental oxygen: Secondary | ICD-10-CM | POA: Diagnosis not present

## 2015-06-01 DIAGNOSIS — J189 Pneumonia, unspecified organism: Secondary | ICD-10-CM | POA: Diagnosis not present

## 2015-06-01 DIAGNOSIS — A419 Sepsis, unspecified organism: Secondary | ICD-10-CM | POA: Diagnosis not present

## 2015-06-01 DIAGNOSIS — I6789 Other cerebrovascular disease: Secondary | ICD-10-CM | POA: Diagnosis not present

## 2015-06-01 DIAGNOSIS — D493 Neoplasm of unspecified behavior of breast: Secondary | ICD-10-CM | POA: Diagnosis not present

## 2015-06-01 DIAGNOSIS — J441 Chronic obstructive pulmonary disease with (acute) exacerbation: Secondary | ICD-10-CM | POA: Diagnosis not present

## 2015-06-08 DIAGNOSIS — R262 Difficulty in walking, not elsewhere classified: Secondary | ICD-10-CM | POA: Diagnosis not present

## 2015-06-08 DIAGNOSIS — J189 Pneumonia, unspecified organism: Secondary | ICD-10-CM | POA: Diagnosis not present

## 2015-06-08 DIAGNOSIS — J441 Chronic obstructive pulmonary disease with (acute) exacerbation: Secondary | ICD-10-CM | POA: Diagnosis not present

## 2015-06-08 DIAGNOSIS — J9621 Acute and chronic respiratory failure with hypoxia: Secondary | ICD-10-CM | POA: Diagnosis not present

## 2015-06-11 DIAGNOSIS — E119 Type 2 diabetes mellitus without complications: Secondary | ICD-10-CM | POA: Diagnosis not present

## 2015-06-11 DIAGNOSIS — E7800 Pure hypercholesterolemia, unspecified: Secondary | ICD-10-CM | POA: Diagnosis not present

## 2015-06-11 DIAGNOSIS — E039 Hypothyroidism, unspecified: Secondary | ICD-10-CM | POA: Diagnosis not present

## 2015-06-11 DIAGNOSIS — I129 Hypertensive chronic kidney disease with stage 1 through stage 4 chronic kidney disease, or unspecified chronic kidney disease: Secondary | ICD-10-CM | POA: Diagnosis not present

## 2015-07-03 DIAGNOSIS — J449 Chronic obstructive pulmonary disease, unspecified: Secondary | ICD-10-CM | POA: Diagnosis not present

## 2015-07-03 DIAGNOSIS — D539 Nutritional anemia, unspecified: Secondary | ICD-10-CM | POA: Diagnosis not present

## 2015-07-03 DIAGNOSIS — J9610 Chronic respiratory failure, unspecified whether with hypoxia or hypercapnia: Secondary | ICD-10-CM | POA: Diagnosis not present

## 2015-07-03 DIAGNOSIS — I10 Essential (primary) hypertension: Secondary | ICD-10-CM | POA: Diagnosis not present

## 2015-07-03 DIAGNOSIS — I6990 Unspecified sequelae of unspecified cerebrovascular disease: Secondary | ICD-10-CM | POA: Diagnosis not present

## 2015-07-09 DIAGNOSIS — I69351 Hemiplegia and hemiparesis following cerebral infarction affecting right dominant side: Secondary | ICD-10-CM | POA: Diagnosis not present

## 2015-07-09 DIAGNOSIS — Z79891 Long term (current) use of opiate analgesic: Secondary | ICD-10-CM | POA: Diagnosis not present

## 2015-07-09 DIAGNOSIS — N185 Chronic kidney disease, stage 5: Secondary | ICD-10-CM | POA: Diagnosis not present

## 2015-07-09 DIAGNOSIS — J449 Chronic obstructive pulmonary disease, unspecified: Secondary | ICD-10-CM | POA: Diagnosis not present

## 2015-07-09 DIAGNOSIS — Z720 Tobacco use: Secondary | ICD-10-CM | POA: Diagnosis not present

## 2015-07-09 DIAGNOSIS — I120 Hypertensive chronic kidney disease with stage 5 chronic kidney disease or end stage renal disease: Secondary | ICD-10-CM | POA: Diagnosis not present

## 2015-07-09 DIAGNOSIS — Z9181 History of falling: Secondary | ICD-10-CM | POA: Diagnosis not present

## 2015-07-09 DIAGNOSIS — M1990 Unspecified osteoarthritis, unspecified site: Secondary | ICD-10-CM | POA: Diagnosis not present

## 2015-07-09 DIAGNOSIS — Z9981 Dependence on supplemental oxygen: Secondary | ICD-10-CM | POA: Diagnosis not present

## 2015-07-13 DIAGNOSIS — J449 Chronic obstructive pulmonary disease, unspecified: Secondary | ICD-10-CM | POA: Diagnosis not present

## 2015-07-13 DIAGNOSIS — J9610 Chronic respiratory failure, unspecified whether with hypoxia or hypercapnia: Secondary | ICD-10-CM | POA: Diagnosis not present

## 2015-07-14 DIAGNOSIS — M1990 Unspecified osteoarthritis, unspecified site: Secondary | ICD-10-CM | POA: Diagnosis not present

## 2015-07-14 DIAGNOSIS — Z720 Tobacco use: Secondary | ICD-10-CM | POA: Diagnosis not present

## 2015-07-14 DIAGNOSIS — Z9181 History of falling: Secondary | ICD-10-CM | POA: Diagnosis not present

## 2015-07-14 DIAGNOSIS — I69351 Hemiplegia and hemiparesis following cerebral infarction affecting right dominant side: Secondary | ICD-10-CM | POA: Diagnosis not present

## 2015-07-14 DIAGNOSIS — Z79891 Long term (current) use of opiate analgesic: Secondary | ICD-10-CM | POA: Diagnosis not present

## 2015-07-14 DIAGNOSIS — I120 Hypertensive chronic kidney disease with stage 5 chronic kidney disease or end stage renal disease: Secondary | ICD-10-CM | POA: Diagnosis not present

## 2015-07-14 DIAGNOSIS — Z9981 Dependence on supplemental oxygen: Secondary | ICD-10-CM | POA: Diagnosis not present

## 2015-07-14 DIAGNOSIS — J449 Chronic obstructive pulmonary disease, unspecified: Secondary | ICD-10-CM | POA: Diagnosis not present

## 2015-07-14 DIAGNOSIS — N185 Chronic kidney disease, stage 5: Secondary | ICD-10-CM | POA: Diagnosis not present

## 2015-07-17 DIAGNOSIS — N185 Chronic kidney disease, stage 5: Secondary | ICD-10-CM | POA: Diagnosis not present

## 2015-07-17 DIAGNOSIS — Z720 Tobacco use: Secondary | ICD-10-CM | POA: Diagnosis not present

## 2015-07-17 DIAGNOSIS — Z9981 Dependence on supplemental oxygen: Secondary | ICD-10-CM | POA: Diagnosis not present

## 2015-07-17 DIAGNOSIS — I120 Hypertensive chronic kidney disease with stage 5 chronic kidney disease or end stage renal disease: Secondary | ICD-10-CM | POA: Diagnosis not present

## 2015-07-17 DIAGNOSIS — J449 Chronic obstructive pulmonary disease, unspecified: Secondary | ICD-10-CM | POA: Diagnosis not present

## 2015-07-17 DIAGNOSIS — Z9181 History of falling: Secondary | ICD-10-CM | POA: Diagnosis not present

## 2015-07-17 DIAGNOSIS — Z79891 Long term (current) use of opiate analgesic: Secondary | ICD-10-CM | POA: Diagnosis not present

## 2015-07-17 DIAGNOSIS — I69351 Hemiplegia and hemiparesis following cerebral infarction affecting right dominant side: Secondary | ICD-10-CM | POA: Diagnosis not present

## 2015-07-17 DIAGNOSIS — M1990 Unspecified osteoarthritis, unspecified site: Secondary | ICD-10-CM | POA: Diagnosis not present

## 2015-07-20 DIAGNOSIS — I120 Hypertensive chronic kidney disease with stage 5 chronic kidney disease or end stage renal disease: Secondary | ICD-10-CM | POA: Diagnosis not present

## 2015-07-20 DIAGNOSIS — M1990 Unspecified osteoarthritis, unspecified site: Secondary | ICD-10-CM | POA: Diagnosis not present

## 2015-07-20 DIAGNOSIS — Z9181 History of falling: Secondary | ICD-10-CM | POA: Diagnosis not present

## 2015-07-20 DIAGNOSIS — N185 Chronic kidney disease, stage 5: Secondary | ICD-10-CM | POA: Diagnosis not present

## 2015-07-20 DIAGNOSIS — I69351 Hemiplegia and hemiparesis following cerebral infarction affecting right dominant side: Secondary | ICD-10-CM | POA: Diagnosis not present

## 2015-07-20 DIAGNOSIS — Z720 Tobacco use: Secondary | ICD-10-CM | POA: Diagnosis not present

## 2015-07-20 DIAGNOSIS — J449 Chronic obstructive pulmonary disease, unspecified: Secondary | ICD-10-CM | POA: Diagnosis not present

## 2015-07-20 DIAGNOSIS — Z79891 Long term (current) use of opiate analgesic: Secondary | ICD-10-CM | POA: Diagnosis not present

## 2015-07-20 DIAGNOSIS — Z9981 Dependence on supplemental oxygen: Secondary | ICD-10-CM | POA: Diagnosis not present

## 2015-07-24 DIAGNOSIS — I69351 Hemiplegia and hemiparesis following cerebral infarction affecting right dominant side: Secondary | ICD-10-CM | POA: Diagnosis not present

## 2015-07-24 DIAGNOSIS — M1990 Unspecified osteoarthritis, unspecified site: Secondary | ICD-10-CM | POA: Diagnosis not present

## 2015-07-24 DIAGNOSIS — J449 Chronic obstructive pulmonary disease, unspecified: Secondary | ICD-10-CM | POA: Diagnosis not present

## 2015-07-24 DIAGNOSIS — Z9181 History of falling: Secondary | ICD-10-CM | POA: Diagnosis not present

## 2015-07-24 DIAGNOSIS — N185 Chronic kidney disease, stage 5: Secondary | ICD-10-CM | POA: Diagnosis not present

## 2015-07-24 DIAGNOSIS — I120 Hypertensive chronic kidney disease with stage 5 chronic kidney disease or end stage renal disease: Secondary | ICD-10-CM | POA: Diagnosis not present

## 2015-07-24 DIAGNOSIS — Z720 Tobacco use: Secondary | ICD-10-CM | POA: Diagnosis not present

## 2015-07-24 DIAGNOSIS — Z79891 Long term (current) use of opiate analgesic: Secondary | ICD-10-CM | POA: Diagnosis not present

## 2015-07-24 DIAGNOSIS — Z9981 Dependence on supplemental oxygen: Secondary | ICD-10-CM | POA: Diagnosis not present

## 2015-07-29 DIAGNOSIS — J449 Chronic obstructive pulmonary disease, unspecified: Secondary | ICD-10-CM | POA: Diagnosis not present

## 2015-07-29 DIAGNOSIS — Z720 Tobacco use: Secondary | ICD-10-CM | POA: Diagnosis not present

## 2015-07-29 DIAGNOSIS — Z79891 Long term (current) use of opiate analgesic: Secondary | ICD-10-CM | POA: Diagnosis not present

## 2015-07-29 DIAGNOSIS — M1990 Unspecified osteoarthritis, unspecified site: Secondary | ICD-10-CM | POA: Diagnosis not present

## 2015-07-29 DIAGNOSIS — I69351 Hemiplegia and hemiparesis following cerebral infarction affecting right dominant side: Secondary | ICD-10-CM | POA: Diagnosis not present

## 2015-07-29 DIAGNOSIS — Z9181 History of falling: Secondary | ICD-10-CM | POA: Diagnosis not present

## 2015-07-29 DIAGNOSIS — I120 Hypertensive chronic kidney disease with stage 5 chronic kidney disease or end stage renal disease: Secondary | ICD-10-CM | POA: Diagnosis not present

## 2015-07-29 DIAGNOSIS — N185 Chronic kidney disease, stage 5: Secondary | ICD-10-CM | POA: Diagnosis not present

## 2015-07-29 DIAGNOSIS — Z9981 Dependence on supplemental oxygen: Secondary | ICD-10-CM | POA: Diagnosis not present

## 2015-07-30 DIAGNOSIS — M1990 Unspecified osteoarthritis, unspecified site: Secondary | ICD-10-CM | POA: Diagnosis not present

## 2015-07-30 DIAGNOSIS — N185 Chronic kidney disease, stage 5: Secondary | ICD-10-CM | POA: Diagnosis not present

## 2015-07-30 DIAGNOSIS — Z720 Tobacco use: Secondary | ICD-10-CM | POA: Diagnosis not present

## 2015-07-30 DIAGNOSIS — Z9981 Dependence on supplemental oxygen: Secondary | ICD-10-CM | POA: Diagnosis not present

## 2015-07-30 DIAGNOSIS — J449 Chronic obstructive pulmonary disease, unspecified: Secondary | ICD-10-CM | POA: Diagnosis not present

## 2015-07-30 DIAGNOSIS — I120 Hypertensive chronic kidney disease with stage 5 chronic kidney disease or end stage renal disease: Secondary | ICD-10-CM | POA: Diagnosis not present

## 2015-07-30 DIAGNOSIS — Z79891 Long term (current) use of opiate analgesic: Secondary | ICD-10-CM | POA: Diagnosis not present

## 2015-07-30 DIAGNOSIS — Z9181 History of falling: Secondary | ICD-10-CM | POA: Diagnosis not present

## 2015-07-30 DIAGNOSIS — I69351 Hemiplegia and hemiparesis following cerebral infarction affecting right dominant side: Secondary | ICD-10-CM | POA: Diagnosis not present

## 2015-07-31 DIAGNOSIS — J449 Chronic obstructive pulmonary disease, unspecified: Secondary | ICD-10-CM | POA: Diagnosis not present

## 2015-07-31 DIAGNOSIS — Z9981 Dependence on supplemental oxygen: Secondary | ICD-10-CM | POA: Diagnosis not present

## 2015-07-31 DIAGNOSIS — M1990 Unspecified osteoarthritis, unspecified site: Secondary | ICD-10-CM | POA: Diagnosis not present

## 2015-07-31 DIAGNOSIS — Z79891 Long term (current) use of opiate analgesic: Secondary | ICD-10-CM | POA: Diagnosis not present

## 2015-07-31 DIAGNOSIS — I120 Hypertensive chronic kidney disease with stage 5 chronic kidney disease or end stage renal disease: Secondary | ICD-10-CM | POA: Diagnosis not present

## 2015-07-31 DIAGNOSIS — I69351 Hemiplegia and hemiparesis following cerebral infarction affecting right dominant side: Secondary | ICD-10-CM | POA: Diagnosis not present

## 2015-07-31 DIAGNOSIS — Z720 Tobacco use: Secondary | ICD-10-CM | POA: Diagnosis not present

## 2015-07-31 DIAGNOSIS — N390 Urinary tract infection, site not specified: Secondary | ICD-10-CM | POA: Diagnosis not present

## 2015-07-31 DIAGNOSIS — Z9181 History of falling: Secondary | ICD-10-CM | POA: Diagnosis not present

## 2015-07-31 DIAGNOSIS — N185 Chronic kidney disease, stage 5: Secondary | ICD-10-CM | POA: Diagnosis not present

## 2015-08-03 DIAGNOSIS — I120 Hypertensive chronic kidney disease with stage 5 chronic kidney disease or end stage renal disease: Secondary | ICD-10-CM | POA: Diagnosis not present

## 2015-08-03 DIAGNOSIS — Z9981 Dependence on supplemental oxygen: Secondary | ICD-10-CM | POA: Diagnosis not present

## 2015-08-03 DIAGNOSIS — Z79891 Long term (current) use of opiate analgesic: Secondary | ICD-10-CM | POA: Diagnosis not present

## 2015-08-03 DIAGNOSIS — Z720 Tobacco use: Secondary | ICD-10-CM | POA: Diagnosis not present

## 2015-08-03 DIAGNOSIS — Z9181 History of falling: Secondary | ICD-10-CM | POA: Diagnosis not present

## 2015-08-03 DIAGNOSIS — M1990 Unspecified osteoarthritis, unspecified site: Secondary | ICD-10-CM | POA: Diagnosis not present

## 2015-08-03 DIAGNOSIS — N185 Chronic kidney disease, stage 5: Secondary | ICD-10-CM | POA: Diagnosis not present

## 2015-08-03 DIAGNOSIS — I69351 Hemiplegia and hemiparesis following cerebral infarction affecting right dominant side: Secondary | ICD-10-CM | POA: Diagnosis not present

## 2015-08-03 DIAGNOSIS — J449 Chronic obstructive pulmonary disease, unspecified: Secondary | ICD-10-CM | POA: Diagnosis not present

## 2015-08-03 DIAGNOSIS — J9610 Chronic respiratory failure, unspecified whether with hypoxia or hypercapnia: Secondary | ICD-10-CM | POA: Diagnosis not present

## 2015-08-05 DIAGNOSIS — M1990 Unspecified osteoarthritis, unspecified site: Secondary | ICD-10-CM | POA: Diagnosis not present

## 2015-08-05 DIAGNOSIS — Z9181 History of falling: Secondary | ICD-10-CM | POA: Diagnosis not present

## 2015-08-05 DIAGNOSIS — I120 Hypertensive chronic kidney disease with stage 5 chronic kidney disease or end stage renal disease: Secondary | ICD-10-CM | POA: Diagnosis not present

## 2015-08-05 DIAGNOSIS — Z9981 Dependence on supplemental oxygen: Secondary | ICD-10-CM | POA: Diagnosis not present

## 2015-08-05 DIAGNOSIS — Z79891 Long term (current) use of opiate analgesic: Secondary | ICD-10-CM | POA: Diagnosis not present

## 2015-08-05 DIAGNOSIS — N185 Chronic kidney disease, stage 5: Secondary | ICD-10-CM | POA: Diagnosis not present

## 2015-08-05 DIAGNOSIS — J449 Chronic obstructive pulmonary disease, unspecified: Secondary | ICD-10-CM | POA: Diagnosis not present

## 2015-08-05 DIAGNOSIS — I69351 Hemiplegia and hemiparesis following cerebral infarction affecting right dominant side: Secondary | ICD-10-CM | POA: Diagnosis not present

## 2015-08-05 DIAGNOSIS — Z720 Tobacco use: Secondary | ICD-10-CM | POA: Diagnosis not present

## 2015-08-12 DIAGNOSIS — Z9181 History of falling: Secondary | ICD-10-CM | POA: Diagnosis not present

## 2015-08-12 DIAGNOSIS — I69351 Hemiplegia and hemiparesis following cerebral infarction affecting right dominant side: Secondary | ICD-10-CM | POA: Diagnosis not present

## 2015-08-12 DIAGNOSIS — J449 Chronic obstructive pulmonary disease, unspecified: Secondary | ICD-10-CM | POA: Diagnosis not present

## 2015-08-12 DIAGNOSIS — M1990 Unspecified osteoarthritis, unspecified site: Secondary | ICD-10-CM | POA: Diagnosis not present

## 2015-08-12 DIAGNOSIS — I120 Hypertensive chronic kidney disease with stage 5 chronic kidney disease or end stage renal disease: Secondary | ICD-10-CM | POA: Diagnosis not present

## 2015-08-12 DIAGNOSIS — Z720 Tobacco use: Secondary | ICD-10-CM | POA: Diagnosis not present

## 2015-08-12 DIAGNOSIS — Z9981 Dependence on supplemental oxygen: Secondary | ICD-10-CM | POA: Diagnosis not present

## 2015-08-12 DIAGNOSIS — Z79891 Long term (current) use of opiate analgesic: Secondary | ICD-10-CM | POA: Diagnosis not present

## 2015-08-12 DIAGNOSIS — N185 Chronic kidney disease, stage 5: Secondary | ICD-10-CM | POA: Diagnosis not present

## 2015-08-13 DIAGNOSIS — J9610 Chronic respiratory failure, unspecified whether with hypoxia or hypercapnia: Secondary | ICD-10-CM | POA: Diagnosis not present

## 2015-08-13 DIAGNOSIS — J449 Chronic obstructive pulmonary disease, unspecified: Secondary | ICD-10-CM | POA: Diagnosis not present

## 2015-08-14 DIAGNOSIS — Z9981 Dependence on supplemental oxygen: Secondary | ICD-10-CM | POA: Diagnosis not present

## 2015-08-14 DIAGNOSIS — I69351 Hemiplegia and hemiparesis following cerebral infarction affecting right dominant side: Secondary | ICD-10-CM | POA: Diagnosis not present

## 2015-08-14 DIAGNOSIS — M1990 Unspecified osteoarthritis, unspecified site: Secondary | ICD-10-CM | POA: Diagnosis not present

## 2015-08-14 DIAGNOSIS — N185 Chronic kidney disease, stage 5: Secondary | ICD-10-CM | POA: Diagnosis not present

## 2015-08-14 DIAGNOSIS — Z720 Tobacco use: Secondary | ICD-10-CM | POA: Diagnosis not present

## 2015-08-14 DIAGNOSIS — J449 Chronic obstructive pulmonary disease, unspecified: Secondary | ICD-10-CM | POA: Diagnosis not present

## 2015-08-14 DIAGNOSIS — Z79891 Long term (current) use of opiate analgesic: Secondary | ICD-10-CM | POA: Diagnosis not present

## 2015-08-14 DIAGNOSIS — Z9181 History of falling: Secondary | ICD-10-CM | POA: Diagnosis not present

## 2015-08-14 DIAGNOSIS — I120 Hypertensive chronic kidney disease with stage 5 chronic kidney disease or end stage renal disease: Secondary | ICD-10-CM | POA: Diagnosis not present

## 2015-08-18 DIAGNOSIS — I10 Essential (primary) hypertension: Secondary | ICD-10-CM | POA: Diagnosis not present

## 2015-08-18 DIAGNOSIS — N390 Urinary tract infection, site not specified: Secondary | ICD-10-CM | POA: Diagnosis not present

## 2015-08-18 DIAGNOSIS — N185 Chronic kidney disease, stage 5: Secondary | ICD-10-CM | POA: Diagnosis not present

## 2015-08-18 DIAGNOSIS — I6990 Unspecified sequelae of unspecified cerebrovascular disease: Secondary | ICD-10-CM | POA: Diagnosis not present

## 2015-08-18 DIAGNOSIS — J449 Chronic obstructive pulmonary disease, unspecified: Secondary | ICD-10-CM | POA: Diagnosis not present

## 2015-08-19 DIAGNOSIS — I120 Hypertensive chronic kidney disease with stage 5 chronic kidney disease or end stage renal disease: Secondary | ICD-10-CM | POA: Diagnosis not present

## 2015-08-19 DIAGNOSIS — Z9181 History of falling: Secondary | ICD-10-CM | POA: Diagnosis not present

## 2015-08-19 DIAGNOSIS — Z9981 Dependence on supplemental oxygen: Secondary | ICD-10-CM | POA: Diagnosis not present

## 2015-08-19 DIAGNOSIS — Z720 Tobacco use: Secondary | ICD-10-CM | POA: Diagnosis not present

## 2015-08-19 DIAGNOSIS — J449 Chronic obstructive pulmonary disease, unspecified: Secondary | ICD-10-CM | POA: Diagnosis not present

## 2015-08-19 DIAGNOSIS — N185 Chronic kidney disease, stage 5: Secondary | ICD-10-CM | POA: Diagnosis not present

## 2015-08-19 DIAGNOSIS — Z79891 Long term (current) use of opiate analgesic: Secondary | ICD-10-CM | POA: Diagnosis not present

## 2015-08-19 DIAGNOSIS — I69351 Hemiplegia and hemiparesis following cerebral infarction affecting right dominant side: Secondary | ICD-10-CM | POA: Diagnosis not present

## 2015-08-19 DIAGNOSIS — M1990 Unspecified osteoarthritis, unspecified site: Secondary | ICD-10-CM | POA: Diagnosis not present

## 2015-08-21 DIAGNOSIS — I120 Hypertensive chronic kidney disease with stage 5 chronic kidney disease or end stage renal disease: Secondary | ICD-10-CM | POA: Diagnosis not present

## 2015-08-21 DIAGNOSIS — Z9981 Dependence on supplemental oxygen: Secondary | ICD-10-CM | POA: Diagnosis not present

## 2015-08-21 DIAGNOSIS — Z79891 Long term (current) use of opiate analgesic: Secondary | ICD-10-CM | POA: Diagnosis not present

## 2015-08-21 DIAGNOSIS — Z9181 History of falling: Secondary | ICD-10-CM | POA: Diagnosis not present

## 2015-08-21 DIAGNOSIS — J449 Chronic obstructive pulmonary disease, unspecified: Secondary | ICD-10-CM | POA: Diagnosis not present

## 2015-08-21 DIAGNOSIS — M1990 Unspecified osteoarthritis, unspecified site: Secondary | ICD-10-CM | POA: Diagnosis not present

## 2015-08-21 DIAGNOSIS — N185 Chronic kidney disease, stage 5: Secondary | ICD-10-CM | POA: Diagnosis not present

## 2015-08-21 DIAGNOSIS — Z720 Tobacco use: Secondary | ICD-10-CM | POA: Diagnosis not present

## 2015-08-21 DIAGNOSIS — I69351 Hemiplegia and hemiparesis following cerebral infarction affecting right dominant side: Secondary | ICD-10-CM | POA: Diagnosis not present

## 2015-08-26 DIAGNOSIS — N185 Chronic kidney disease, stage 5: Secondary | ICD-10-CM | POA: Diagnosis not present

## 2015-08-26 DIAGNOSIS — J449 Chronic obstructive pulmonary disease, unspecified: Secondary | ICD-10-CM | POA: Diagnosis not present

## 2015-08-26 DIAGNOSIS — Z9981 Dependence on supplemental oxygen: Secondary | ICD-10-CM | POA: Diagnosis not present

## 2015-08-26 DIAGNOSIS — I69351 Hemiplegia and hemiparesis following cerebral infarction affecting right dominant side: Secondary | ICD-10-CM | POA: Diagnosis not present

## 2015-08-26 DIAGNOSIS — Z9181 History of falling: Secondary | ICD-10-CM | POA: Diagnosis not present

## 2015-08-26 DIAGNOSIS — Z720 Tobacco use: Secondary | ICD-10-CM | POA: Diagnosis not present

## 2015-08-26 DIAGNOSIS — Z79891 Long term (current) use of opiate analgesic: Secondary | ICD-10-CM | POA: Diagnosis not present

## 2015-08-26 DIAGNOSIS — I120 Hypertensive chronic kidney disease with stage 5 chronic kidney disease or end stage renal disease: Secondary | ICD-10-CM | POA: Diagnosis not present

## 2015-08-26 DIAGNOSIS — M1990 Unspecified osteoarthritis, unspecified site: Secondary | ICD-10-CM | POA: Diagnosis not present

## 2015-09-02 DIAGNOSIS — Z720 Tobacco use: Secondary | ICD-10-CM | POA: Diagnosis not present

## 2015-09-02 DIAGNOSIS — Z79891 Long term (current) use of opiate analgesic: Secondary | ICD-10-CM | POA: Diagnosis not present

## 2015-09-02 DIAGNOSIS — I69351 Hemiplegia and hemiparesis following cerebral infarction affecting right dominant side: Secondary | ICD-10-CM | POA: Diagnosis not present

## 2015-09-02 DIAGNOSIS — J9610 Chronic respiratory failure, unspecified whether with hypoxia or hypercapnia: Secondary | ICD-10-CM | POA: Diagnosis not present

## 2015-09-02 DIAGNOSIS — M1990 Unspecified osteoarthritis, unspecified site: Secondary | ICD-10-CM | POA: Diagnosis not present

## 2015-09-02 DIAGNOSIS — N185 Chronic kidney disease, stage 5: Secondary | ICD-10-CM | POA: Diagnosis not present

## 2015-09-02 DIAGNOSIS — I120 Hypertensive chronic kidney disease with stage 5 chronic kidney disease or end stage renal disease: Secondary | ICD-10-CM | POA: Diagnosis not present

## 2015-09-02 DIAGNOSIS — Z9981 Dependence on supplemental oxygen: Secondary | ICD-10-CM | POA: Diagnosis not present

## 2015-09-02 DIAGNOSIS — Z9181 History of falling: Secondary | ICD-10-CM | POA: Diagnosis not present

## 2015-09-02 DIAGNOSIS — J449 Chronic obstructive pulmonary disease, unspecified: Secondary | ICD-10-CM | POA: Diagnosis not present

## 2015-09-06 DIAGNOSIS — R410 Disorientation, unspecified: Secondary | ICD-10-CM | POA: Diagnosis not present

## 2015-09-06 DIAGNOSIS — R402441 Other coma, without documented Glasgow coma scale score, or with partial score reported, in the field [EMT or ambulance]: Secondary | ICD-10-CM | POA: Diagnosis not present

## 2015-09-06 DIAGNOSIS — R0602 Shortness of breath: Secondary | ICD-10-CM | POA: Diagnosis not present

## 2015-09-06 DIAGNOSIS — R4182 Altered mental status, unspecified: Secondary | ICD-10-CM | POA: Diagnosis not present

## 2015-09-06 DIAGNOSIS — R0902 Hypoxemia: Secondary | ICD-10-CM | POA: Diagnosis not present

## 2015-09-06 DIAGNOSIS — J441 Chronic obstructive pulmonary disease with (acute) exacerbation: Secondary | ICD-10-CM | POA: Diagnosis not present

## 2015-09-06 DIAGNOSIS — N289 Disorder of kidney and ureter, unspecified: Secondary | ICD-10-CM | POA: Diagnosis not present

## 2015-09-07 DIAGNOSIS — I129 Hypertensive chronic kidney disease with stage 1 through stage 4 chronic kidney disease, or unspecified chronic kidney disease: Secondary | ICD-10-CM | POA: Diagnosis not present

## 2015-09-07 DIAGNOSIS — B9689 Other specified bacterial agents as the cause of diseases classified elsewhere: Secondary | ICD-10-CM | POA: Diagnosis not present

## 2015-09-07 DIAGNOSIS — R0902 Hypoxemia: Secondary | ICD-10-CM | POA: Diagnosis not present

## 2015-09-07 DIAGNOSIS — E790 Hyperuricemia without signs of inflammatory arthritis and tophaceous disease: Secondary | ICD-10-CM | POA: Diagnosis not present

## 2015-09-07 DIAGNOSIS — N390 Urinary tract infection, site not specified: Secondary | ICD-10-CM | POA: Diagnosis not present

## 2015-09-07 DIAGNOSIS — N289 Disorder of kidney and ureter, unspecified: Secondary | ICD-10-CM | POA: Diagnosis not present

## 2015-09-07 DIAGNOSIS — M810 Age-related osteoporosis without current pathological fracture: Secondary | ICD-10-CM | POA: Diagnosis not present

## 2015-09-07 DIAGNOSIS — I779 Disorder of arteries and arterioles, unspecified: Secondary | ICD-10-CM | POA: Diagnosis not present

## 2015-09-07 DIAGNOSIS — I69319 Unspecified symptoms and signs involving cognitive functions following cerebral infarction: Secondary | ICD-10-CM | POA: Diagnosis not present

## 2015-09-07 DIAGNOSIS — M109 Gout, unspecified: Secondary | ICD-10-CM | POA: Diagnosis not present

## 2015-09-07 DIAGNOSIS — N179 Acute kidney failure, unspecified: Secondary | ICD-10-CM | POA: Diagnosis not present

## 2015-09-07 DIAGNOSIS — I2510 Atherosclerotic heart disease of native coronary artery without angina pectoris: Secondary | ICD-10-CM | POA: Diagnosis not present

## 2015-09-07 DIAGNOSIS — E7800 Pure hypercholesterolemia, unspecified: Secondary | ICD-10-CM | POA: Diagnosis not present

## 2015-09-07 DIAGNOSIS — R918 Other nonspecific abnormal finding of lung field: Secondary | ICD-10-CM | POA: Diagnosis not present

## 2015-09-07 DIAGNOSIS — T17908A Unspecified foreign body in respiratory tract, part unspecified causing other injury, initial encounter: Secondary | ICD-10-CM | POA: Diagnosis not present

## 2015-09-07 DIAGNOSIS — I671 Cerebral aneurysm, nonruptured: Secondary | ICD-10-CM | POA: Diagnosis not present

## 2015-09-07 DIAGNOSIS — Z720 Tobacco use: Secondary | ICD-10-CM | POA: Diagnosis not present

## 2015-09-07 DIAGNOSIS — R5381 Other malaise: Secondary | ICD-10-CM | POA: Diagnosis not present

## 2015-09-07 DIAGNOSIS — E039 Hypothyroidism, unspecified: Secondary | ICD-10-CM | POA: Diagnosis not present

## 2015-09-07 DIAGNOSIS — I714 Abdominal aortic aneurysm, without rupture: Secondary | ICD-10-CM | POA: Diagnosis not present

## 2015-09-07 DIAGNOSIS — J449 Chronic obstructive pulmonary disease, unspecified: Secondary | ICD-10-CM | POA: Diagnosis not present

## 2015-09-07 DIAGNOSIS — J9601 Acute respiratory failure with hypoxia: Secondary | ICD-10-CM | POA: Diagnosis not present

## 2015-09-07 DIAGNOSIS — G9340 Encephalopathy, unspecified: Secondary | ICD-10-CM | POA: Diagnosis not present

## 2015-09-07 DIAGNOSIS — Z9119 Patient's noncompliance with other medical treatment and regimen: Secondary | ICD-10-CM | POA: Diagnosis not present

## 2015-09-07 DIAGNOSIS — I69998 Other sequelae following unspecified cerebrovascular disease: Secondary | ICD-10-CM | POA: Diagnosis not present

## 2015-09-07 DIAGNOSIS — N189 Chronic kidney disease, unspecified: Secondary | ICD-10-CM | POA: Diagnosis not present

## 2015-09-07 DIAGNOSIS — I38 Endocarditis, valve unspecified: Secondary | ICD-10-CM | POA: Diagnosis not present

## 2015-09-07 DIAGNOSIS — H9190 Unspecified hearing loss, unspecified ear: Secondary | ICD-10-CM | POA: Diagnosis not present

## 2015-09-07 DIAGNOSIS — K5730 Diverticulosis of large intestine without perforation or abscess without bleeding: Secondary | ICD-10-CM | POA: Diagnosis not present

## 2015-09-07 DIAGNOSIS — R410 Disorientation, unspecified: Secondary | ICD-10-CM | POA: Diagnosis not present

## 2015-09-07 DIAGNOSIS — R627 Adult failure to thrive: Secondary | ICD-10-CM | POA: Diagnosis not present

## 2015-09-07 DIAGNOSIS — R4182 Altered mental status, unspecified: Secondary | ICD-10-CM | POA: Diagnosis not present

## 2015-09-07 DIAGNOSIS — E119 Type 2 diabetes mellitus without complications: Secondary | ICD-10-CM | POA: Diagnosis not present

## 2015-09-07 DIAGNOSIS — M1990 Unspecified osteoarthritis, unspecified site: Secondary | ICD-10-CM | POA: Diagnosis not present

## 2015-09-07 DIAGNOSIS — I509 Heart failure, unspecified: Secondary | ICD-10-CM | POA: Diagnosis not present

## 2015-09-07 DIAGNOSIS — Z9981 Dependence on supplemental oxygen: Secondary | ICD-10-CM | POA: Diagnosis not present

## 2015-09-07 DIAGNOSIS — I69951 Hemiplegia and hemiparesis following unspecified cerebrovascular disease affecting right dominant side: Secondary | ICD-10-CM | POA: Diagnosis not present

## 2015-09-07 DIAGNOSIS — R0602 Shortness of breath: Secondary | ICD-10-CM | POA: Diagnosis not present

## 2015-09-07 DIAGNOSIS — D493 Neoplasm of unspecified behavior of breast: Secondary | ICD-10-CM | POA: Diagnosis not present

## 2015-09-07 DIAGNOSIS — J441 Chronic obstructive pulmonary disease with (acute) exacerbation: Secondary | ICD-10-CM | POA: Diagnosis not present

## 2015-09-07 DIAGNOSIS — E785 Hyperlipidemia, unspecified: Secondary | ICD-10-CM | POA: Diagnosis not present

## 2015-09-07 DIAGNOSIS — I1310 Hypertensive heart and chronic kidney disease without heart failure, with stage 1 through stage 4 chronic kidney disease, or unspecified chronic kidney disease: Secondary | ICD-10-CM | POA: Diagnosis not present

## 2015-09-07 DIAGNOSIS — K219 Gastro-esophageal reflux disease without esophagitis: Secondary | ICD-10-CM | POA: Diagnosis not present

## 2015-09-07 DIAGNOSIS — N184 Chronic kidney disease, stage 4 (severe): Secondary | ICD-10-CM | POA: Diagnosis not present

## 2015-09-11 DIAGNOSIS — Z7952 Long term (current) use of systemic steroids: Secondary | ICD-10-CM | POA: Diagnosis not present

## 2015-09-11 DIAGNOSIS — I1310 Hypertensive heart and chronic kidney disease without heart failure, with stage 1 through stage 4 chronic kidney disease, or unspecified chronic kidney disease: Secondary | ICD-10-CM | POA: Diagnosis not present

## 2015-09-11 DIAGNOSIS — R262 Difficulty in walking, not elsewhere classified: Secondary | ICD-10-CM | POA: Diagnosis not present

## 2015-09-11 DIAGNOSIS — N184 Chronic kidney disease, stage 4 (severe): Secondary | ICD-10-CM | POA: Diagnosis not present

## 2015-09-11 DIAGNOSIS — D493 Neoplasm of unspecified behavior of breast: Secondary | ICD-10-CM | POA: Diagnosis not present

## 2015-09-11 DIAGNOSIS — I2510 Atherosclerotic heart disease of native coronary artery without angina pectoris: Secondary | ICD-10-CM | POA: Diagnosis not present

## 2015-09-11 DIAGNOSIS — J9601 Acute respiratory failure with hypoxia: Secondary | ICD-10-CM | POA: Diagnosis not present

## 2015-09-11 DIAGNOSIS — I671 Cerebral aneurysm, nonruptured: Secondary | ICD-10-CM | POA: Diagnosis not present

## 2015-09-11 DIAGNOSIS — I69319 Unspecified symptoms and signs involving cognitive functions following cerebral infarction: Secondary | ICD-10-CM | POA: Diagnosis not present

## 2015-09-11 DIAGNOSIS — J9621 Acute and chronic respiratory failure with hypoxia: Secondary | ICD-10-CM | POA: Diagnosis not present

## 2015-09-11 DIAGNOSIS — E7800 Pure hypercholesterolemia, unspecified: Secondary | ICD-10-CM | POA: Diagnosis not present

## 2015-09-11 DIAGNOSIS — I69951 Hemiplegia and hemiparesis following unspecified cerebrovascular disease affecting right dominant side: Secondary | ICD-10-CM | POA: Diagnosis not present

## 2015-09-11 DIAGNOSIS — Z79899 Other long term (current) drug therapy: Secondary | ICD-10-CM | POA: Diagnosis not present

## 2015-09-11 DIAGNOSIS — K5730 Diverticulosis of large intestine without perforation or abscess without bleeding: Secondary | ICD-10-CM | POA: Diagnosis not present

## 2015-09-11 DIAGNOSIS — S0081XA Abrasion of other part of head, initial encounter: Secondary | ICD-10-CM | POA: Diagnosis not present

## 2015-09-11 DIAGNOSIS — E119 Type 2 diabetes mellitus without complications: Secondary | ICD-10-CM | POA: Diagnosis not present

## 2015-09-11 DIAGNOSIS — R627 Adult failure to thrive: Secondary | ICD-10-CM | POA: Diagnosis not present

## 2015-09-11 DIAGNOSIS — S0003XA Contusion of scalp, initial encounter: Secondary | ICD-10-CM | POA: Diagnosis not present

## 2015-09-11 DIAGNOSIS — J449 Chronic obstructive pulmonary disease, unspecified: Secondary | ICD-10-CM | POA: Diagnosis not present

## 2015-09-11 DIAGNOSIS — H9190 Unspecified hearing loss, unspecified ear: Secondary | ICD-10-CM | POA: Diagnosis not present

## 2015-09-11 DIAGNOSIS — Z720 Tobacco use: Secondary | ICD-10-CM | POA: Diagnosis not present

## 2015-09-11 DIAGNOSIS — S0010XA Contusion of unspecified eyelid and periocular area, initial encounter: Secondary | ICD-10-CM | POA: Diagnosis not present

## 2015-09-11 DIAGNOSIS — S0990XA Unspecified injury of head, initial encounter: Secondary | ICD-10-CM | POA: Diagnosis not present

## 2015-09-11 DIAGNOSIS — Z8673 Personal history of transient ischemic attack (TIA), and cerebral infarction without residual deficits: Secondary | ICD-10-CM | POA: Diagnosis not present

## 2015-09-11 DIAGNOSIS — R5381 Other malaise: Secondary | ICD-10-CM | POA: Diagnosis not present

## 2015-09-11 DIAGNOSIS — S60811A Abrasion of right wrist, initial encounter: Secondary | ICD-10-CM | POA: Diagnosis not present

## 2015-09-11 DIAGNOSIS — I639 Cerebral infarction, unspecified: Secondary | ICD-10-CM | POA: Diagnosis not present

## 2015-09-11 DIAGNOSIS — Z9981 Dependence on supplemental oxygen: Secondary | ICD-10-CM | POA: Diagnosis not present

## 2015-09-11 DIAGNOSIS — I509 Heart failure, unspecified: Secondary | ICD-10-CM | POA: Diagnosis not present

## 2015-09-11 DIAGNOSIS — I38 Endocarditis, valve unspecified: Secondary | ICD-10-CM | POA: Diagnosis not present

## 2015-09-11 DIAGNOSIS — M810 Age-related osteoporosis without current pathological fracture: Secondary | ICD-10-CM | POA: Diagnosis not present

## 2015-09-11 DIAGNOSIS — S0181XA Laceration without foreign body of other part of head, initial encounter: Secondary | ICD-10-CM | POA: Diagnosis not present

## 2015-09-11 DIAGNOSIS — S098XXA Other specified injuries of head, initial encounter: Secondary | ICD-10-CM | POA: Diagnosis not present

## 2015-09-11 DIAGNOSIS — M109 Gout, unspecified: Secondary | ICD-10-CM | POA: Diagnosis not present

## 2015-09-11 DIAGNOSIS — I10 Essential (primary) hypertension: Secondary | ICD-10-CM | POA: Diagnosis not present

## 2015-09-11 DIAGNOSIS — I714 Abdominal aortic aneurysm, without rupture: Secondary | ICD-10-CM | POA: Diagnosis not present

## 2015-09-11 DIAGNOSIS — I69998 Other sequelae following unspecified cerebrovascular disease: Secondary | ICD-10-CM | POA: Diagnosis not present

## 2015-09-11 DIAGNOSIS — M1990 Unspecified osteoarthritis, unspecified site: Secondary | ICD-10-CM | POA: Diagnosis not present

## 2015-09-15 DIAGNOSIS — I10 Essential (primary) hypertension: Secondary | ICD-10-CM | POA: Diagnosis not present

## 2015-09-15 DIAGNOSIS — S0003XA Contusion of scalp, initial encounter: Secondary | ICD-10-CM | POA: Diagnosis not present

## 2015-09-15 DIAGNOSIS — S0990XA Unspecified injury of head, initial encounter: Secondary | ICD-10-CM | POA: Diagnosis not present

## 2015-09-15 DIAGNOSIS — I2510 Atherosclerotic heart disease of native coronary artery without angina pectoris: Secondary | ICD-10-CM | POA: Diagnosis not present

## 2015-09-15 DIAGNOSIS — Z79899 Other long term (current) drug therapy: Secondary | ICD-10-CM | POA: Diagnosis not present

## 2015-09-15 DIAGNOSIS — S0081XA Abrasion of other part of head, initial encounter: Secondary | ICD-10-CM | POA: Diagnosis not present

## 2015-09-15 DIAGNOSIS — S0181XA Laceration without foreign body of other part of head, initial encounter: Secondary | ICD-10-CM | POA: Diagnosis not present

## 2015-09-15 DIAGNOSIS — J449 Chronic obstructive pulmonary disease, unspecified: Secondary | ICD-10-CM | POA: Diagnosis not present

## 2015-09-15 DIAGNOSIS — Z8673 Personal history of transient ischemic attack (TIA), and cerebral infarction without residual deficits: Secondary | ICD-10-CM | POA: Diagnosis not present

## 2015-09-15 DIAGNOSIS — Z7952 Long term (current) use of systemic steroids: Secondary | ICD-10-CM | POA: Diagnosis not present

## 2015-09-15 DIAGNOSIS — S60811A Abrasion of right wrist, initial encounter: Secondary | ICD-10-CM | POA: Diagnosis not present

## 2015-09-18 DIAGNOSIS — R262 Difficulty in walking, not elsewhere classified: Secondary | ICD-10-CM | POA: Diagnosis not present

## 2015-09-18 DIAGNOSIS — I639 Cerebral infarction, unspecified: Secondary | ICD-10-CM | POA: Diagnosis not present

## 2015-09-18 DIAGNOSIS — J9621 Acute and chronic respiratory failure with hypoxia: Secondary | ICD-10-CM | POA: Diagnosis not present

## 2015-09-25 DIAGNOSIS — S76012A Strain of muscle, fascia and tendon of left hip, initial encounter: Secondary | ICD-10-CM | POA: Diagnosis not present

## 2015-09-25 DIAGNOSIS — M25559 Pain in unspecified hip: Secondary | ICD-10-CM | POA: Diagnosis not present

## 2015-09-25 DIAGNOSIS — S3993XA Unspecified injury of pelvis, initial encounter: Secondary | ICD-10-CM | POA: Diagnosis not present

## 2015-09-25 DIAGNOSIS — R259 Unspecified abnormal involuntary movements: Secondary | ICD-10-CM | POA: Diagnosis not present

## 2015-09-25 DIAGNOSIS — S0003XA Contusion of scalp, initial encounter: Secondary | ICD-10-CM | POA: Diagnosis not present

## 2015-09-28 DIAGNOSIS — S0083XS Contusion of other part of head, sequela: Secondary | ICD-10-CM | POA: Diagnosis not present

## 2015-09-28 DIAGNOSIS — K219 Gastro-esophageal reflux disease without esophagitis: Secondary | ICD-10-CM | POA: Diagnosis not present

## 2015-09-28 DIAGNOSIS — N184 Chronic kidney disease, stage 4 (severe): Secondary | ICD-10-CM | POA: Diagnosis not present

## 2015-10-01 DIAGNOSIS — S51812A Laceration without foreign body of left forearm, initial encounter: Secondary | ICD-10-CM | POA: Diagnosis not present

## 2015-10-01 DIAGNOSIS — R259 Unspecified abnormal involuntary movements: Secondary | ICD-10-CM | POA: Diagnosis not present

## 2015-10-01 DIAGNOSIS — Z9181 History of falling: Secondary | ICD-10-CM | POA: Diagnosis not present

## 2015-10-01 DIAGNOSIS — S0003XA Contusion of scalp, initial encounter: Secondary | ICD-10-CM | POA: Diagnosis not present

## 2015-10-02 DIAGNOSIS — R2689 Other abnormalities of gait and mobility: Secondary | ICD-10-CM | POA: Diagnosis not present

## 2015-10-02 DIAGNOSIS — R259 Unspecified abnormal involuntary movements: Secondary | ICD-10-CM | POA: Diagnosis not present

## 2015-10-03 DIAGNOSIS — J9610 Chronic respiratory failure, unspecified whether with hypoxia or hypercapnia: Secondary | ICD-10-CM | POA: Diagnosis not present

## 2015-10-05 DIAGNOSIS — R1310 Dysphagia, unspecified: Secondary | ICD-10-CM | POA: Diagnosis not present

## 2015-10-13 DIAGNOSIS — J449 Chronic obstructive pulmonary disease, unspecified: Secondary | ICD-10-CM | POA: Diagnosis not present

## 2015-10-14 ENCOUNTER — Encounter (HOSPITAL_COMMUNITY): Payer: Self-pay | Admitting: Emergency Medicine

## 2015-10-14 ENCOUNTER — Emergency Department (HOSPITAL_COMMUNITY): Payer: Medicare Other

## 2015-10-14 ENCOUNTER — Emergency Department (HOSPITAL_COMMUNITY)
Admission: EM | Admit: 2015-10-14 | Discharge: 2015-10-14 | Disposition: A | Discharge status: Home / Self Care | Payer: Medicare Other | Attending: Emergency Medicine | Admitting: Emergency Medicine

## 2015-10-14 DIAGNOSIS — Z853 Personal history of malignant neoplasm of breast: Secondary | ICD-10-CM | POA: Diagnosis not present

## 2015-10-14 DIAGNOSIS — S060X9A Concussion with loss of consciousness of unspecified duration, initial encounter: Secondary | ICD-10-CM

## 2015-10-14 DIAGNOSIS — S0083XA Contusion of other part of head, initial encounter: Secondary | ICD-10-CM | POA: Insufficient documentation

## 2015-10-14 DIAGNOSIS — W06XXXA Fall from bed, initial encounter: Secondary | ICD-10-CM | POA: Diagnosis not present

## 2015-10-14 DIAGNOSIS — S065X0A Traumatic subdural hemorrhage without loss of consciousness, initial encounter: Secondary | ICD-10-CM | POA: Diagnosis not present

## 2015-10-14 DIAGNOSIS — D631 Anemia in chronic kidney disease: Secondary | ICD-10-CM | POA: Diagnosis not present

## 2015-10-14 DIAGNOSIS — T148 Other injury of unspecified body region: Secondary | ICD-10-CM | POA: Diagnosis not present

## 2015-10-14 DIAGNOSIS — N184 Chronic kidney disease, stage 4 (severe): Secondary | ICD-10-CM | POA: Diagnosis not present

## 2015-10-14 DIAGNOSIS — S81012A Laceration without foreign body, left knee, initial encounter: Secondary | ICD-10-CM | POA: Diagnosis not present

## 2015-10-14 DIAGNOSIS — Y92239 Unspecified place in hospital as the place of occurrence of the external cause: Secondary | ICD-10-CM | POA: Diagnosis not present

## 2015-10-14 DIAGNOSIS — I2510 Atherosclerotic heart disease of native coronary artery without angina pectoris: Secondary | ICD-10-CM | POA: Diagnosis not present

## 2015-10-14 DIAGNOSIS — Y939 Activity, unspecified: Secondary | ICD-10-CM | POA: Insufficient documentation

## 2015-10-14 DIAGNOSIS — Z79899 Other long term (current) drug therapy: Secondary | ICD-10-CM | POA: Diagnosis not present

## 2015-10-14 DIAGNOSIS — N189 Chronic kidney disease, unspecified: Secondary | ICD-10-CM | POA: Insufficient documentation

## 2015-10-14 DIAGNOSIS — I6200 Nontraumatic subdural hemorrhage, unspecified: Secondary | ICD-10-CM

## 2015-10-14 DIAGNOSIS — F0390 Unspecified dementia without behavioral disturbance: Secondary | ICD-10-CM | POA: Insufficient documentation

## 2015-10-14 DIAGNOSIS — S299XXA Unspecified injury of thorax, initial encounter: Secondary | ICD-10-CM | POA: Diagnosis not present

## 2015-10-14 DIAGNOSIS — G308 Other Alzheimer's disease: Secondary | ICD-10-CM | POA: Diagnosis not present

## 2015-10-14 DIAGNOSIS — Z8673 Personal history of transient ischemic attack (TIA), and cerebral infarction without residual deficits: Secondary | ICD-10-CM | POA: Diagnosis not present

## 2015-10-14 DIAGNOSIS — R402421 Glasgow coma scale score 9-12, in the field [EMT or ambulance]: Secondary | ICD-10-CM | POA: Diagnosis not present

## 2015-10-14 DIAGNOSIS — S065X9D Traumatic subdural hemorrhage with loss of consciousness of unspecified duration, subsequent encounter: Secondary | ICD-10-CM | POA: Diagnosis not present

## 2015-10-14 DIAGNOSIS — Z87891 Personal history of nicotine dependence: Secondary | ICD-10-CM | POA: Diagnosis not present

## 2015-10-14 DIAGNOSIS — K219 Gastro-esophageal reflux disease without esophagitis: Secondary | ICD-10-CM | POA: Diagnosis not present

## 2015-10-14 DIAGNOSIS — M7989 Other specified soft tissue disorders: Secondary | ICD-10-CM | POA: Diagnosis not present

## 2015-10-14 DIAGNOSIS — J449 Chronic obstructive pulmonary disease, unspecified: Secondary | ICD-10-CM | POA: Diagnosis not present

## 2015-10-14 DIAGNOSIS — E7800 Pure hypercholesterolemia, unspecified: Secondary | ICD-10-CM | POA: Diagnosis not present

## 2015-10-14 DIAGNOSIS — T07 Unspecified multiple injuries: Secondary | ICD-10-CM | POA: Diagnosis not present

## 2015-10-14 DIAGNOSIS — M25562 Pain in left knee: Secondary | ICD-10-CM | POA: Diagnosis not present

## 2015-10-14 DIAGNOSIS — S199XXA Unspecified injury of neck, initial encounter: Secondary | ICD-10-CM | POA: Diagnosis not present

## 2015-10-14 DIAGNOSIS — Y999 Unspecified external cause status: Secondary | ICD-10-CM | POA: Insufficient documentation

## 2015-10-14 DIAGNOSIS — S6992XA Unspecified injury of left wrist, hand and finger(s), initial encounter: Secondary | ICD-10-CM | POA: Diagnosis not present

## 2015-10-14 DIAGNOSIS — W19XXXA Unspecified fall, initial encounter: Secondary | ICD-10-CM

## 2015-10-14 DIAGNOSIS — I129 Hypertensive chronic kidney disease with stage 1 through stage 4 chronic kidney disease, or unspecified chronic kidney disease: Secondary | ICD-10-CM | POA: Insufficient documentation

## 2015-10-14 DIAGNOSIS — T07XXXA Unspecified multiple injuries, initial encounter: Secondary | ICD-10-CM

## 2015-10-14 DIAGNOSIS — Z515 Encounter for palliative care: Secondary | ICD-10-CM

## 2015-10-14 DIAGNOSIS — E039 Hypothyroidism, unspecified: Secondary | ICD-10-CM | POA: Diagnosis not present

## 2015-10-14 DIAGNOSIS — S0993XA Unspecified injury of face, initial encounter: Secondary | ICD-10-CM | POA: Diagnosis not present

## 2015-10-14 DIAGNOSIS — K458 Other specified abdominal hernia without obstruction or gangrene: Secondary | ICD-10-CM

## 2015-10-14 DIAGNOSIS — S0990XA Unspecified injury of head, initial encounter: Secondary | ICD-10-CM | POA: Diagnosis present

## 2015-10-14 DIAGNOSIS — S065X9A Traumatic subdural hemorrhage with loss of consciousness of unspecified duration, initial encounter: Secondary | ICD-10-CM

## 2015-10-14 DIAGNOSIS — I5189 Other ill-defined heart diseases: Secondary | ICD-10-CM | POA: Diagnosis not present

## 2015-10-14 DIAGNOSIS — Z66 Do not resuscitate: Secondary | ICD-10-CM | POA: Diagnosis not present

## 2015-10-14 DIAGNOSIS — S069X9D Unspecified intracranial injury with loss of consciousness of unspecified duration, subsequent encounter: Secondary | ICD-10-CM | POA: Diagnosis not present

## 2015-10-14 DIAGNOSIS — J432 Centrilobular emphysema: Secondary | ICD-10-CM | POA: Diagnosis not present

## 2015-10-14 DIAGNOSIS — D649 Anemia, unspecified: Secondary | ICD-10-CM | POA: Diagnosis not present

## 2015-10-14 DIAGNOSIS — S069X9A Unspecified intracranial injury with loss of consciousness of unspecified duration, initial encounter: Secondary | ICD-10-CM

## 2015-10-14 DIAGNOSIS — S0031XA Abrasion of nose, initial encounter: Secondary | ICD-10-CM | POA: Diagnosis not present

## 2015-10-14 DIAGNOSIS — Z7952 Long term (current) use of systemic steroids: Secondary | ICD-10-CM | POA: Diagnosis not present

## 2015-10-14 DIAGNOSIS — F03C0 Unspecified dementia, severe, without behavioral disturbance, psychotic disturbance, mood disturbance, and anxiety: Secondary | ICD-10-CM

## 2015-10-14 LAB — BASIC METABOLIC PANEL
Anion gap: 7 (ref 5–15)
BUN: 33 mg/dL — AB (ref 6–20)
CHLORIDE: 114 mmol/L — AB (ref 101–111)
CO2: 24 mmol/L (ref 22–32)
Calcium: 8.4 mg/dL — ABNORMAL LOW (ref 8.9–10.3)
Creatinine, Ser: 2.56 mg/dL — ABNORMAL HIGH (ref 0.44–1.00)
GFR calc Af Amer: 19 mL/min — ABNORMAL LOW (ref >60)
GFR calc non Af Amer: 16 mL/min — ABNORMAL LOW (ref >60)
GLUCOSE: 107 mg/dL — AB (ref 65–99)
POTASSIUM: 3.4 mmol/L — AB (ref 3.5–5.1)
Sodium: 145 mmol/L (ref 135–145)

## 2015-10-14 LAB — CBC WITH DIFFERENTIAL/PLATELET
Basophils Absolute: 0 10*3/uL (ref 0.0–0.1)
Basophils Relative: 0 %
EOS PCT: 0 %
Eosinophils Absolute: 0 10*3/uL (ref 0.0–0.7)
HCT: 27.5 % — ABNORMAL LOW (ref 36.0–46.0)
HEMOGLOBIN: 8.6 g/dL — AB (ref 12.0–15.0)
LYMPHS ABS: 1.7 10*3/uL (ref 0.7–4.0)
LYMPHS PCT: 13 %
MCH: 31.7 pg (ref 26.0–34.0)
MCHC: 31.3 g/dL (ref 30.0–36.0)
MCV: 101.5 fL — AB (ref 78.0–100.0)
MONOS PCT: 8 %
Monocytes Absolute: 1 10*3/uL (ref 0.1–1.0)
NEUTROS PCT: 79 %
Neutro Abs: 10.1 10*3/uL — ABNORMAL HIGH (ref 1.7–7.7)
Platelets: 219 10*3/uL (ref 150–400)
RBC: 2.71 MIL/uL — AB (ref 3.87–5.11)
RDW: 14.2 % (ref 11.5–15.5)
WBC: 12.9 10*3/uL — AB (ref 4.0–10.5)

## 2015-10-14 MED ORDER — LIDOCAINE-EPINEPHRINE (PF) 2 %-1:200000 IJ SOLN
10.0000 mL | Freq: Once | INTRAMUSCULAR | Status: AC
  Administered 2015-10-14: 10 mL
  Filled 2015-10-14: qty 20

## 2015-10-14 NOTE — Discharge Instructions (Signed)
Danielle Erickson IS FOUND TO HAVE A SMALL SUBDURAL HEMORRHAGE ON CT EXAMINATION. THE INJURY WAS DISCUSSED WITH NEUROSURGERY WHO PERSONALLY REVIEWED THE CT SCAN AND DETERMINED THAT SHE CAN BE DISCHARGED HOME. SHE NEEDS TO STOP TAKING [employer]. HER LEFT WRIST DEFORMITY IS OLD AND THERE IS NO NEW INJURY SEEN ON IMAGING TODAY. THERE WAS A LACERATION TO THE LEFT KNEE WHICH WAS REPAIRED. X-RAY OF THE KNEE WAS NEGATIVE. LAB AND IMAGING RESULTS ARE PROVIDED BELOW.   Results for orders placed or performed during the hospital encounter of 10/14/15  CBC with Differential  Result Value Ref Range   WBC 12.9 (H) 4.0 - 10.5 K/uL   RBC 2.71 (L) 3.87 - 5.11 MIL/uL   Hemoglobin 8.6 (L) 12.0 - 15.0 g/dL   HCT 27.5 (L) 36.0 - 46.0 %   MCV 101.5 (H) 78.0 - 100.0 fL   MCH 31.7 26.0 - 34.0 pg   MCHC 31.3 30.0 - 36.0 g/dL   RDW 14.2 11.5 - 15.5 %   Platelets 219 150 - 400 K/uL   Neutrophils Relative % 79 %   Neutro Abs 10.1 (H) 1.7 - 7.7 K/uL   Lymphocytes Relative 13 %   Lymphs Abs 1.7 0.7 - 4.0 K/uL   Monocytes Relative 8 %   Monocytes Absolute 1.0 0.1 - 1.0 K/uL   Eosinophils Relative 0 %   Eosinophils Absolute 0.0 0.0 - 0.7 K/uL   Basophils Relative 0 %   Basophils Absolute 0.0 0.0 - 0.1 K/uL  Basic metabolic panel  Result Value Ref Range   Sodium 145 135 - 145 mmol/L   Potassium 3.4 (L) 3.5 - 5.1 mmol/L   Chloride 114 (H) 101 - 111 mmol/L   CO2 24 22 - 32 mmol/L   Glucose, Bld 107 (H) 65 - 99 mg/dL   BUN 33 (H) 6 - 20 mg/dL   Creatinine, Ser 2.56 (H) 0.44 - 1.00 mg/dL   Calcium 8.4 (L) 8.9 - 10.3 mg/dL   GFR calc non Af Amer 16 (L) >60 mL/min   GFR calc Af Amer 19 (L) >60 mL/min   Anion gap 7 5 - 15   Dg Wrist Complete Left  10/14/2015  CLINICAL DATA:  Status post fall, with left wrist deformity. Initial encounter. EXAM: LEFT WRIST - COMPLETE 3+ VIEW COMPARISON:  Left wrist radiograph performed 02/10/2015 FINDINGS: There is deformity of the distal radial metaphysis, with prominent radial and dorsal  displacement, and dorsal angulation. The carpal rows articulate with the displaced distal radial metaphysis. Would correlate clinically as to whether the patient has chronic wrist deformity, as this fracture is in identical location to that seen in 2016, and no definite acute fracture line is characterized. There is mild underlying chronic deformity of the distal ulnar diaphysis. Soft tissue swelling is noted about the wrist. The carpal rows are grossly unremarkable in appearance. Minimal degenerative change is noted at the first carpometacarpal joint. IMPRESSION: 1. Deformity of the distal radial metaphysis, with prominent radial and dorsal displacement, and dorsal angulation. Would correlate clinically as to whether the patient has chronic wrist deformity, as this apparent fracture is in identical location to that seen in 2016, and no definite acute fracture line is characterized. The patient's distal radius may have healed in significantly displaced and angulated position, or this could reflect a poorly visualized fracture line. 2. Underlying mild chronic deformity of the distal ulnar diaphysis. 3. Soft tissue swelling about the wrist. These results were called by telephone at the time of interpretation on  10/14/2015 at 2:41 am to Dr. Malachy Moan, who verbally acknowledged these results. Electronically Signed   By: Garald Balding M.D.   On: 10/14/2015 02:41   Ct Head Wo Contrast  10/14/2015  CLINICAL DATA:  82 year old female caps with fall and trauma to the head EXAM: CT HEAD WITHOUT CONTRAST CT MAXILLOFACIAL WITHOUT CONTRAST CT CERVICAL SPINE WITHOUT CONTRAST TECHNIQUE: Multidetector CT imaging of the head, cervical spine, and maxillofacial structures were performed using the standard protocol without intravenous contrast. Multiplanar CT image reconstructions of the cervical spine and maxillofacial structures were also generated. COMPARISON:  Head CT dated 09/25/2015 FINDINGS: CT HEAD FINDINGS There is a  small subdural hemorrhage over the left temporal, left parietal, and left occipital lobes measuring up to 7 mm in greatest axial thickness. There is mild mass effect on the adjacent brain parenchyma. No midline shift. There is moderate age-related atrophy and chronic microvascular ischemic changes. Old left basal ganglia lacunar infarct noted. The visualized paranasal sinuses and mastoid air cells are clear. The calvarium is intact. Right forehead hematoma. CT MAXILLOFACIAL FINDINGS There is no acute facial bone fractures. The maxilla, mandible, and pterygoid plates are intact. The globes, retro-orbital fat, and orbital walls are preserved. There is mild exophthalmos. CT CERVICAL SPINE FINDINGS There is no acute fracture or subluxation of the cervical spine.There is osteopenia with multilevel degenerative changes of the spine.The odontoid and spinous processes are intact.There is normal anatomic alignment of the C1-C2 lateral masses. The visualized soft tissues appear unremarkable. IMPRESSION: Small left subdural hemorrhage.  Follow-up recommended. No acute facial bone fractures. No acute/ traumatic cervical spine pathology. These results were called by telephone at the time of interpretation on 10/14/2015 at 3:23 am to Dr. Waverly Ferrari, who verbally acknowledged these results. Electronically Signed   By: Anner Crete M.D.   On: 10/14/2015 03:27   Ct Cervical Spine Wo Contrast  10/14/2015  CLINICAL DATA:  82 year old female caps with fall and trauma to the head EXAM: CT HEAD WITHOUT CONTRAST CT MAXILLOFACIAL WITHOUT CONTRAST CT CERVICAL SPINE WITHOUT CONTRAST TECHNIQUE: Multidetector CT imaging of the head, cervical spine, and maxillofacial structures were performed using the standard protocol without intravenous contrast. Multiplanar CT image reconstructions of the cervical spine and maxillofacial structures were also generated. COMPARISON:  Head CT dated 09/25/2015 FINDINGS: CT HEAD FINDINGS There is a small  subdural hemorrhage over the left temporal, left parietal, and left occipital lobes measuring up to 7 mm in greatest axial thickness. There is mild mass effect on the adjacent brain parenchyma. No midline shift. There is moderate age-related atrophy and chronic microvascular ischemic changes. Old left basal ganglia lacunar infarct noted. The visualized paranasal sinuses and mastoid air cells are clear. The calvarium is intact. Right forehead hematoma. CT MAXILLOFACIAL FINDINGS There is no acute facial bone fractures. The maxilla, mandible, and pterygoid plates are intact. The globes, retro-orbital fat, and orbital walls are preserved. There is mild exophthalmos. CT CERVICAL SPINE FINDINGS There is no acute fracture or subluxation of the cervical spine.There is osteopenia with multilevel degenerative changes of the spine.The odontoid and spinous processes are intact.There is normal anatomic alignment of the C1-C2 lateral masses. The visualized soft tissues appear unremarkable. IMPRESSION: Small left subdural hemorrhage.  Follow-up recommended. No acute facial bone fractures. No acute/ traumatic cervical spine pathology. These results were called by telephone at the time of interpretation on 10/14/2015 at 3:23 am to Dr. Waverly Ferrari, who verbally acknowledged these results. Electronically Signed   By: Anner Crete M.D.   On:  10/14/2015 03:27   Dg Knee Complete 4 Views Left  10/14/2015  CLINICAL DATA:  Status post fall, with anterior left knee laceration. Initial encounter. EXAM: LEFT KNEE - COMPLETE 4+ VIEW COMPARISON:  None. FINDINGS: There is no evidence of fracture or dislocation. There is slight narrowing of the lateral compartment. An enthesophyte is seen arising at the upper pole of the patella. No significant joint effusion is seen. The visualized soft tissues are normal in appearance. IMPRESSION: No evidence of fracture or dislocation. Electronically Signed   By: Garald Balding M.D.   On: 10/14/2015 02:33    Ct Maxillofacial Wo Cm  10/14/2015  CLINICAL DATA:  82 year old female caps with fall and trauma to the head EXAM: CT HEAD WITHOUT CONTRAST CT MAXILLOFACIAL WITHOUT CONTRAST CT CERVICAL SPINE WITHOUT CONTRAST TECHNIQUE: Multidetector CT imaging of the head, cervical spine, and maxillofacial structures were performed using the standard protocol without intravenous contrast. Multiplanar CT image reconstructions of the cervical spine and maxillofacial structures were also generated. COMPARISON:  Head CT dated 09/25/2015 FINDINGS: CT HEAD FINDINGS There is a small subdural hemorrhage over the left temporal, left parietal, and left occipital lobes measuring up to 7 mm in greatest axial thickness. There is mild mass effect on the adjacent brain parenchyma. No midline shift. There is moderate age-related atrophy and chronic microvascular ischemic changes. Old left basal ganglia lacunar infarct noted. The visualized paranasal sinuses and mastoid air cells are clear. The calvarium is intact. Right forehead hematoma. CT MAXILLOFACIAL FINDINGS There is no acute facial bone fractures. The maxilla, mandible, and pterygoid plates are intact. The globes, retro-orbital fat, and orbital walls are preserved. There is mild exophthalmos. CT CERVICAL SPINE FINDINGS There is no acute fracture or subluxation of the cervical spine.There is osteopenia with multilevel degenerative changes of the spine.The odontoid and spinous processes are intact.There is normal anatomic alignment of the C1-C2 lateral masses. The visualized soft tissues appear unremarkable. IMPRESSION: Small left subdural hemorrhage.  Follow-up recommended. No acute facial bone fractures. No acute/ traumatic cervical spine pathology. These results were called by telephone at the time of interpretation on 10/14/2015 at 3:23 am to Dr. Waverly Ferrari, who verbally acknowledged these results. Electronically Signed   By: Anner Crete M.D.   On: 10/14/2015 03:27

## 2015-10-14 NOTE — ED Notes (Signed)
EMS called to [facility] in [place] after pt was found by staff laying on the floor, appears she rolled out of bed.  Hx of dementia, non-verbal, frequent falls and diabetic.  Pt is no on blood thinners and according to staff at the facilty is at baseline for mental status.  Pt has a large hematoma above her right eye, left wrist has obvious deformities (currently splinted) and left knee has large laceration.  Pt would not tolerate a c-collar according to EMS

## 2015-10-14 NOTE — ED Notes (Signed)
Patient transported to X-ray, will be taken to CT directly after

## 2015-10-14 NOTE — ED Notes (Signed)
Pharmacy notified for Epi.

## 2015-10-14 NOTE — ED Provider Notes (Signed)
CSN: [id number]     Arrival date & time 10/14/15  0107 History   First MD Initiated Contact with Patient 10/14/15 0147     Chief Complaint  Patient presents with  . Fall     (Consider location/radiation/quality/duration/timing/severity/associated sxs/prior Treatment) HPI Comments: Patient from [place] presents after unwitnessed fall. Per Jeannetta Nap at [place], the patient was found on the floor beside her bed within 5 minutes of last rounding which found her in bed resting. Per EMS, there is a left wrist deformity, left knee injury and facial bruising. Per nursing home, Ms. Snooks has frequent falls when she attempts to ambulate and they try to confine her to a wheelchair. She has advanced dementia, is nonverbal at baseline and is known to be combative and uncooperative. There was no vomiting with tonight's fall. The patient is reported at baseline. She is a full code.   Patient is a 82 y.o. female presenting with fall. The history is provided by the EMS personnel and the nursing home (Nursing home staff member: Jeannetta Nap). No language interpreter was used.  Fall This is a new problem. The current episode started today.    Past Medical History  Diagnosis Date  . Hyperlipidemia   . Hypertension   . Stroke [facility]) 2015, 2016    [facility]  . COPD (chronic obstructive pulmonary disease) ([place])   . Hypothyroidism   . Chronic kidney disease     pt states her crt can run high but has been that way for about 20 years  . Hematuria, undiagnosed cause   . Headache   . Neuromuscular disorder (HCC)     neck tremor  . Cancer [facility])     R mastectomy d/t BrCa about 20 years ago.   Past Surgical History  Procedure Laterality Date  . Cystoscopy    . Abdominal hysterectomy    . Breast surgery    . Cholecystectomy    . Tonsillectomy    . Appendectomy    . Dilation and curettage of uterus    . Mastectomy     No family history on file. Social History  Substance Use  Topics  . Smoking status: Former Smoker -- 1.00 packs/day for 60 years    Types: Cigarettes    Quit date: 10/24/2014  . Smokeless tobacco: None  . Alcohol Use: No   OB History    No data available     Review of Systems  Unable to perform ROS: Dementia      Allergies  Review of patient's allergies indicates no known allergies.  Home Medications   Prior to Admission medications   Medication Sig Start Date End Date Taking? Authorizing Provider  allopurinol (ZYLOPRIM) 100 MG tablet Take 100 mg by mouth daily.    Historical Provider, MD  amLODipine (NORVASC) 5 MG tablet Take 5 mg by mouth daily.    Historical Provider, MD  clonazePAM (KLONOPIN) 0.5 MG tablet Take 1 mg by mouth 2 (two) times daily. Take 1/2 tablet in the morning, and 1 whole tablet at night    Historical Provider, MD  cloNIDine (CATAPRES) 0.2 MG tablet Take 0.2 mg by mouth daily.    Historical Provider, MD  dipyridamole-aspirin (AGGRENOX) 200-25 MG per 12 hr capsule Take 1 capsule by mouth 2 (two) times daily.    Historical Provider, MD  Folic Acid-Vit U1-LKG M01 (FOLPLEX 2.2 PO) Take 1 tablet by mouth daily.    Historical Provider, MD  furosemide (LASIX) 20 MG tablet Take 20  mg by mouth every other day.    Historical Provider, MD  HYDROcodone-acetaminophen (NORCO/VICODIN) 5-325 MG per tablet Take 0.5 tablets by mouth every 6 (six) hours as needed for moderate pain.    Historical Provider, MD  levothyroxine (SYNTHROID, LEVOTHROID) 88 MCG tablet Take 88 mcg by mouth daily before breakfast.    Historical Provider, MD  metoprolol (TOPROL-XL) 200 MG 24 hr tablet Take 200 mg by mouth daily.    Historical Provider, MD  PARoxetine (PAXIL) 40 MG tablet Take 40 mg by mouth every morning.    Historical Provider, MD  rosuvastatin (CRESTOR) 5 MG tablet Take 5 mg by mouth 3 (three) times a week.    Historical Provider, MD   BP 194/91 mmHg  Pulse 85  Temp(Src) 97.9 F (36.6 C) (Axillary)  Resp 17  SpO2 98% Physical Exam    Constitutional: She appears well-developed and well-nourished. No distress.  HENT:  Head: Normocephalic.    No hemotympanum. Large right facial hematoma most prominent over forehead. Small abrasion to bridge of nose. No nasal deformity.   Neck: Normal range of motion.  Cardiovascular: Normal rate.   Pulmonary/Chest: Effort normal. She has no wheezes. She has no rales.  Abdominal: Soft. There is no tenderness.  Musculoskeletal:       Arms:      Legs: Left knee has a 3 cm stellate laceration anteriorly. No bony deformity. There is moderate swelling of the knee with associated bruising. Left wrist has radial and ulnar deformity. Closed injury. Bandaged wound to dorsum of hand applied by nursing home.   Neurological:  Patient sleeping, easily awaken. Withdraws from painful stimuli. Does not follow command. Nonverbal.   Skin: Skin is warm and dry.  Laceration left knee. Abrasion to bridge of nose. There are many bruises and abrasions over her body that are in various stages of healing.     ED Course  Procedures (including critical care time) Labs Review Labs Reviewed  CBC WITH DIFFERENTIAL/PLATELET  BASIC METABOLIC PANEL   Results for orders placed or performed during the hospital encounter of 10/14/15  CBC with Differential  Result Value Ref Range   WBC 12.9 (H) 4.0 - 10.5 K/uL   RBC 2.71 (L) 3.87 - 5.11 MIL/uL   Hemoglobin 8.6 (L) 12.0 - 15.0 g/dL   HCT 27.5 (L) 36.0 - 46.0 %   MCV 101.5 (H) 78.0 - 100.0 fL   MCH 31.7 26.0 - 34.0 pg   MCHC 31.3 30.0 - 36.0 g/dL   RDW 14.2 11.5 - 15.5 %   Platelets 219 150 - 400 K/uL   Neutrophils Relative % 79 %   Neutro Abs 10.1 (H) 1.7 - 7.7 K/uL   Lymphocytes Relative 13 %   Lymphs Abs 1.7 0.7 - 4.0 K/uL   Monocytes Relative 8 %   Monocytes Absolute 1.0 0.1 - 1.0 K/uL   Eosinophils Relative 0 %   Eosinophils Absolute 0.0 0.0 - 0.7 K/uL   Basophils Relative 0 %   Basophils Absolute 0.0 0.0 - 0.1 K/uL   Dg Wrist Complete  Left  10/14/2015  CLINICAL DATA:  Status post fall, with left wrist deformity. Initial encounter. EXAM: LEFT WRIST - COMPLETE 3+ VIEW COMPARISON:  Left wrist radiograph performed 02/10/2015 FINDINGS: There is deformity of the distal radial metaphysis, with prominent radial and dorsal displacement, and dorsal angulation. The carpal rows articulate with the displaced distal radial metaphysis. Would correlate clinically as to whether the patient has chronic wrist deformity, as this fracture is  in identical location to that seen in 2016, and no definite acute fracture line is characterized. There is mild underlying chronic deformity of the distal ulnar diaphysis. Soft tissue swelling is noted about the wrist. The carpal rows are grossly unremarkable in appearance. Minimal degenerative change is noted at the first carpometacarpal joint. IMPRESSION: 1. Deformity of the distal radial metaphysis, with prominent radial and dorsal displacement, and dorsal angulation. Would correlate clinically as to whether the patient has chronic wrist deformity, as this apparent fracture is in identical location to that seen in 2016, and no definite acute fracture line is characterized. The patient's distal radius may have healed in significantly displaced and angulated position, or this could reflect a poorly visualized fracture line. 2. Underlying mild chronic deformity of the distal ulnar diaphysis. 3. Soft tissue swelling about the wrist. These results were called by telephone at the time of interpretation on 10/14/2015 at 2:41 am to Dr. Malachy Moan, who verbally acknowledged these results. Electronically Signed   By: Garald Balding M.D.   On: 10/14/2015 02:41   Ct Head Wo Contrast  10/14/2015  CLINICAL DATA:  82 year old female caps with fall and trauma to the head EXAM: CT HEAD WITHOUT CONTRAST CT MAXILLOFACIAL WITHOUT CONTRAST CT CERVICAL SPINE WITHOUT CONTRAST TECHNIQUE: Multidetector CT imaging of the head, cervical spine, and  maxillofacial structures were performed using the standard protocol without intravenous contrast. Multiplanar CT image reconstructions of the cervical spine and maxillofacial structures were also generated. COMPARISON:  Head CT dated 09/25/2015 FINDINGS: CT HEAD FINDINGS There is a small subdural hemorrhage over the left temporal, left parietal, and left occipital lobes measuring up to 7 mm in greatest axial thickness. There is mild mass effect on the adjacent brain parenchyma. No midline shift. There is moderate age-related atrophy and chronic microvascular ischemic changes. Old left basal ganglia lacunar infarct noted. The visualized paranasal sinuses and mastoid air cells are clear. The calvarium is intact. Right forehead hematoma. CT MAXILLOFACIAL FINDINGS There is no acute facial bone fractures. The maxilla, mandible, and pterygoid plates are intact. The globes, retro-orbital fat, and orbital walls are preserved. There is mild exophthalmos. CT CERVICAL SPINE FINDINGS There is no acute fracture or subluxation of the cervical spine.There is osteopenia with multilevel degenerative changes of the spine.The odontoid and spinous processes are intact.There is normal anatomic alignment of the C1-C2 lateral masses. The visualized soft tissues appear unremarkable. IMPRESSION: Small left subdural hemorrhage.  Follow-up recommended. No acute facial bone fractures. No acute/ traumatic cervical spine pathology. These results were called by telephone at the time of interpretation on 10/14/2015 at 3:23 am to Dr. Waverly Ferrari, who verbally acknowledged these results. Electronically Signed   By: Anner Crete M.D.   On: 10/14/2015 03:27   Ct Cervical Spine Wo Contrast  10/14/2015  CLINICAL DATA:  82 year old female caps with fall and trauma to the head EXAM: CT HEAD WITHOUT CONTRAST CT MAXILLOFACIAL WITHOUT CONTRAST CT CERVICAL SPINE WITHOUT CONTRAST TECHNIQUE: Multidetector CT imaging of the head, cervical spine, and  maxillofacial structures were performed using the standard protocol without intravenous contrast. Multiplanar CT image reconstructions of the cervical spine and maxillofacial structures were also generated. COMPARISON:  Head CT dated 09/25/2015 FINDINGS: CT HEAD FINDINGS There is a small subdural hemorrhage over the left temporal, left parietal, and left occipital lobes measuring up to 7 mm in greatest axial thickness. There is mild mass effect on the adjacent brain parenchyma. No midline shift. There is moderate age-related atrophy and chronic microvascular ischemic changes. Old  left basal ganglia lacunar infarct noted. The visualized paranasal sinuses and mastoid air cells are clear. The calvarium is intact. Right forehead hematoma. CT MAXILLOFACIAL FINDINGS There is no acute facial bone fractures. The maxilla, mandible, and pterygoid plates are intact. The globes, retro-orbital fat, and orbital walls are preserved. There is mild exophthalmos. CT CERVICAL SPINE FINDINGS There is no acute fracture or subluxation of the cervical spine.There is osteopenia with multilevel degenerative changes of the spine.The odontoid and spinous processes are intact.There is normal anatomic alignment of the C1-C2 lateral masses. The visualized soft tissues appear unremarkable. IMPRESSION: Small left subdural hemorrhage.  Follow-up recommended. No acute facial bone fractures. No acute/ traumatic cervical spine pathology. These results were called by telephone at the time of interpretation on 10/14/2015 at 3:23 am to Dr. Waverly Ferrari, who verbally acknowledged these results. Electronically Signed   By: Anner Crete M.D.   On: 10/14/2015 03:27   Dg Knee Complete 4 Views Left  10/14/2015  CLINICAL DATA:  Status post fall, with anterior left knee laceration. Initial encounter. EXAM: LEFT KNEE - COMPLETE 4+ VIEW COMPARISON:  None. FINDINGS: There is no evidence of fracture or dislocation. There is slight narrowing of the lateral  compartment. An enthesophyte is seen arising at the upper pole of the patella. No significant joint effusion is seen. The visualized soft tissues are normal in appearance. IMPRESSION: No evidence of fracture or dislocation. Electronically Signed   By: Garald Balding M.D.   On: 10/14/2015 02:33   Ct Maxillofacial Wo Cm  10/14/2015  CLINICAL DATA:  82 year old female caps with fall and trauma to the head EXAM: CT HEAD WITHOUT CONTRAST CT MAXILLOFACIAL WITHOUT CONTRAST CT CERVICAL SPINE WITHOUT CONTRAST TECHNIQUE: Multidetector CT imaging of the head, cervical spine, and maxillofacial structures were performed using the standard protocol without intravenous contrast. Multiplanar CT image reconstructions of the cervical spine and maxillofacial structures were also generated. COMPARISON:  Head CT dated 09/25/2015 FINDINGS: CT HEAD FINDINGS There is a small subdural hemorrhage over the left temporal, left parietal, and left occipital lobes measuring up to 7 mm in greatest axial thickness. There is mild mass effect on the adjacent brain parenchyma. No midline shift. There is moderate age-related atrophy and chronic microvascular ischemic changes. Old left basal ganglia lacunar infarct noted. The visualized paranasal sinuses and mastoid air cells are clear. The calvarium is intact. Right forehead hematoma. CT MAXILLOFACIAL FINDINGS There is no acute facial bone fractures. The maxilla, mandible, and pterygoid plates are intact. The globes, retro-orbital fat, and orbital walls are preserved. There is mild exophthalmos. CT CERVICAL SPINE FINDINGS There is no acute fracture or subluxation of the cervical spine.There is osteopenia with multilevel degenerative changes of the spine.The odontoid and spinous processes are intact.There is normal anatomic alignment of the C1-C2 lateral masses. The visualized soft tissues appear unremarkable. IMPRESSION: Small left subdural hemorrhage.  Follow-up recommended. No acute facial bone  fractures. No acute/ traumatic cervical spine pathology. These results were called by telephone at the time of interpretation on 10/14/2015 at 3:23 am to Dr. Waverly Ferrari, who verbally acknowledged these results. Electronically Signed   By: Anner Crete M.D.   On: 10/14/2015 03:27    Imaging Review No results found. I have personally reviewed and evaluated these images and lab results as part of my medical decision-making.   EKG Interpretation None      MDM   Final diagnoses:  None    1. Fall 2. subdural hemorrhage 3. Left wrist injury 4. Left knee laceration  5. History of frequent falls 6. Dementia, advanced  Patient arrives from [place] after unwitnessed fall, presumed to have rolled out of bed. Per nursing home staff, patient non-verbal at baseline and no change in mental status since fall. Patient not cooperative on exam, combative with care - also baseline per nursing home staff.   Patient is found to have a left subdural hemorrhage. Discussed with neurosurgery, Dr. Diamantina Monks, who advises that the size of the subdural is not concerning and that she can be cleared from a neurosurgical standpoint.   Left wrist deformity: per radiology, there is an old fracture causing deformity on comparable imaging from 2016 and no definite acute fracture is identified. Re-examination: left wrist is manually manipulated without eliciting a pain response from the patient. Doubt acute fracture.   Multiple bruises with history of frequent falls. There is a laceration to left knee that has been repaired. Imaging of the knee shows no fractures.   The patient is evaluated by Dr. Dina Rich and found appropriate for discharge back to [facility] Advised to stop Aggrenox given small subdural, until cleared by PCP.  Charlann Lange, PA-C 10/14/15 2595  Orpah Greek, MD 10/14/15 [phone]

## 2015-10-14 NOTE — ED Notes (Signed)
Notified [facility] for transportation back home 

## 2015-10-14 NOTE — ED Notes (Signed)
Wound to left knee cleansed and clean dressing applied.

## 2015-10-15 DIAGNOSIS — I10 Essential (primary) hypertension: Secondary | ICD-10-CM

## 2015-10-15 DIAGNOSIS — S065X9A Traumatic subdural hemorrhage with loss of consciousness of unspecified duration, initial encounter: Secondary | ICD-10-CM

## 2015-10-15 DIAGNOSIS — J439 Emphysema, unspecified: Secondary | ICD-10-CM

## 2015-10-15 DIAGNOSIS — E039 Hypothyroidism, unspecified: Secondary | ICD-10-CM

## 2015-10-15 DIAGNOSIS — S069X9A Unspecified intracranial injury with loss of consciousness of unspecified duration, initial encounter: Secondary | ICD-10-CM

## 2015-10-15 DIAGNOSIS — E7800 Pure hypercholesterolemia, unspecified: Secondary | ICD-10-CM

## 2015-10-15 DIAGNOSIS — N189 Chronic kidney disease, unspecified: Secondary | ICD-10-CM

## 2015-10-15 DIAGNOSIS — N184 Chronic kidney disease, stage 4 (severe): Secondary | ICD-10-CM

## 2015-10-24 DEATH — deceased

## 2017-02-06 IMAGING — US US RENAL
1 series · 14 of 25 positions shown · non-contrast
Comparison: 06/18/2004.

CLINICAL DATA: 85-year-old with stage 5 chronic kidney disease.

EXAM:
RENAL / URINARY TRACT ULTRASOUND COMPLETE

[Series 1: us renal · 0.26mm/px · 14 of 48 slices shown]
[im 1/48]
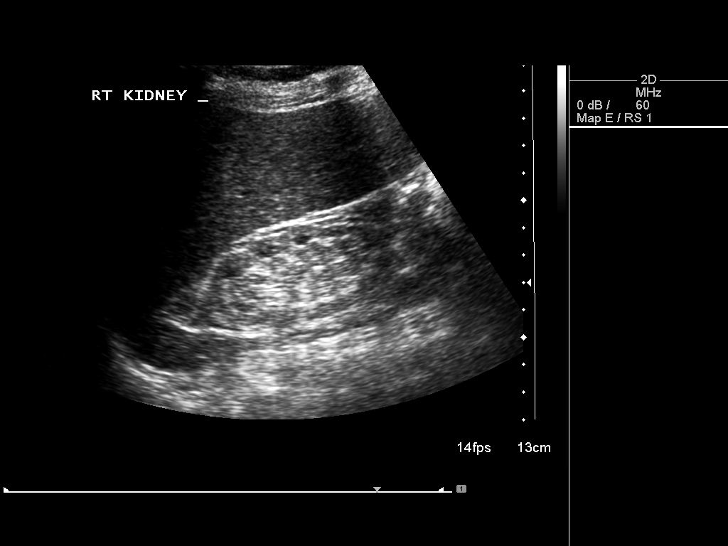
[im 4/48]
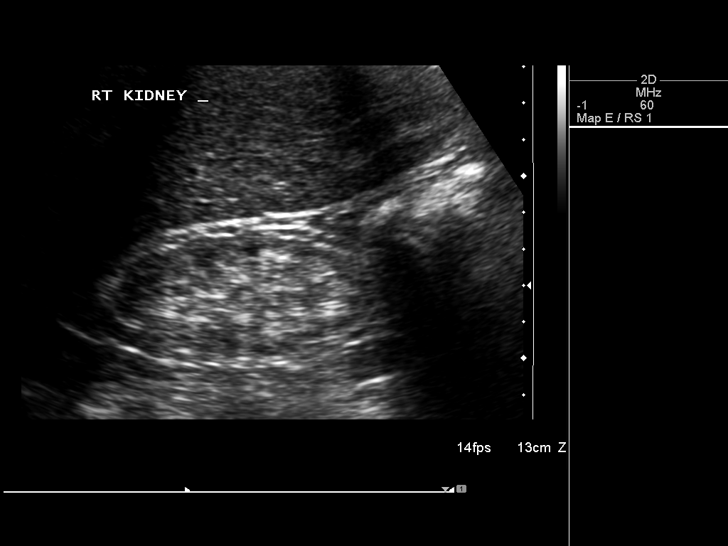
[im 8/48]
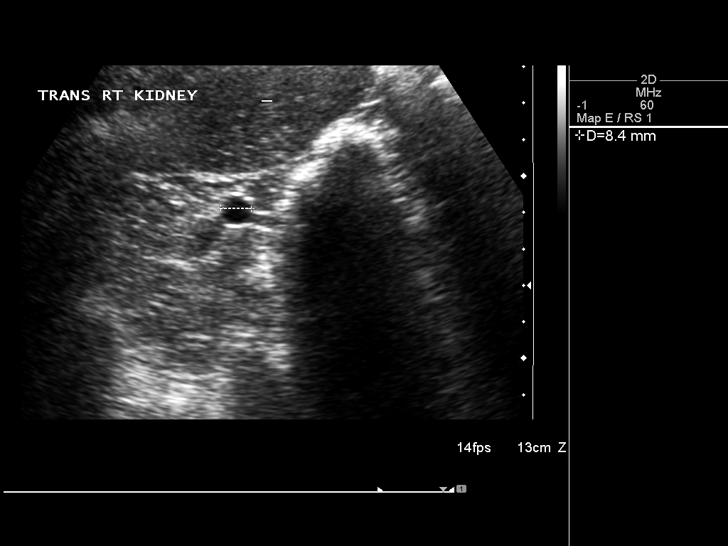
[im 12/48]
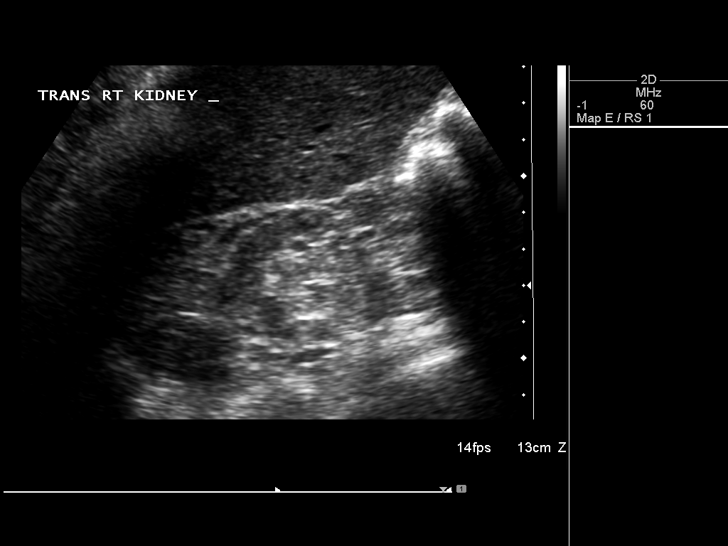
[im 16/48]
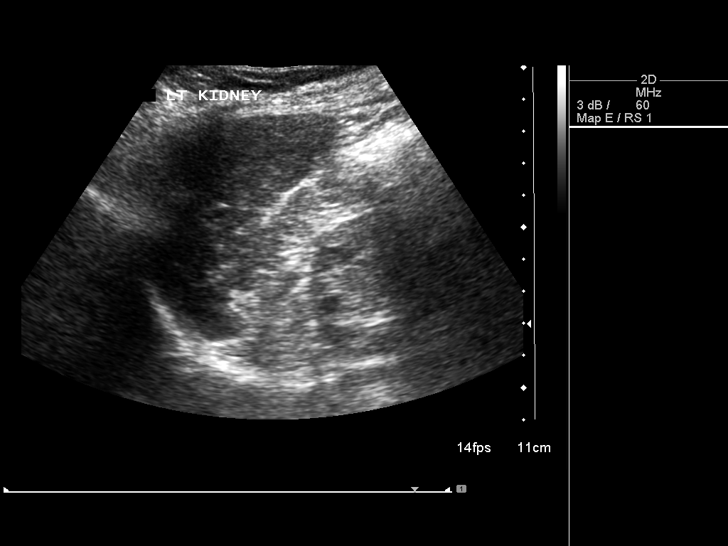
[im 18/48]
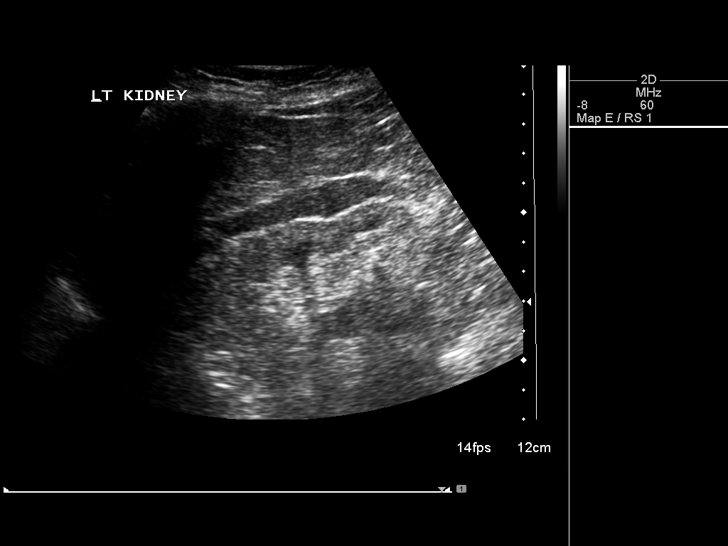
[im 22/48]
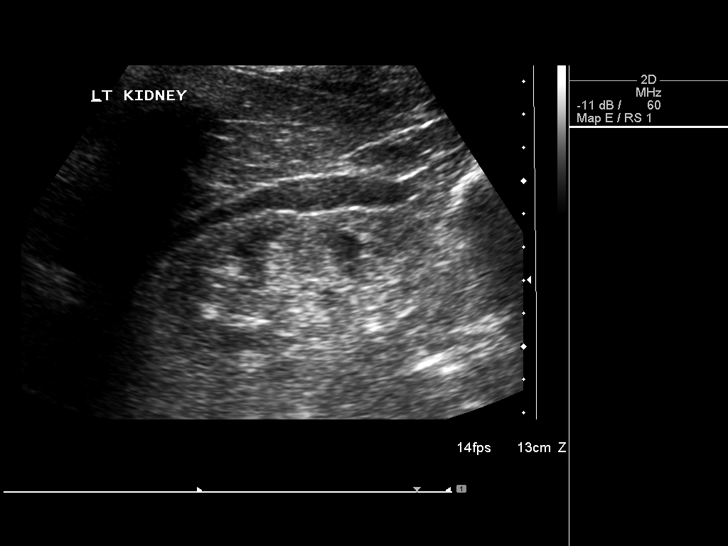
[im 26/48]
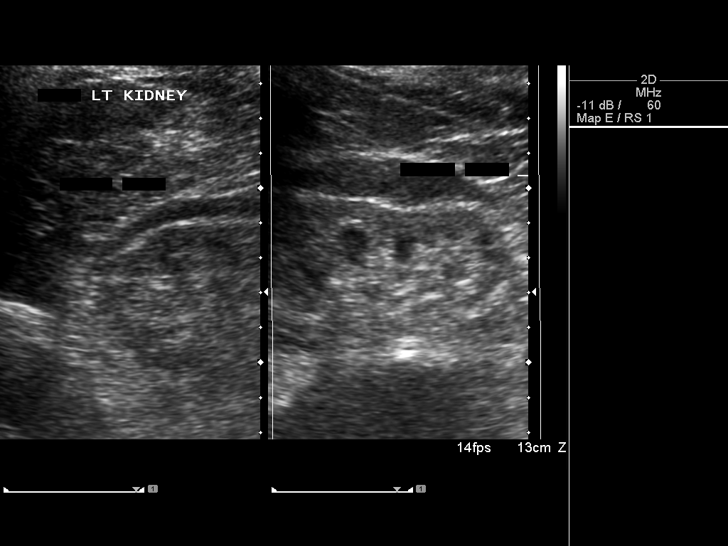
[im 30/48]
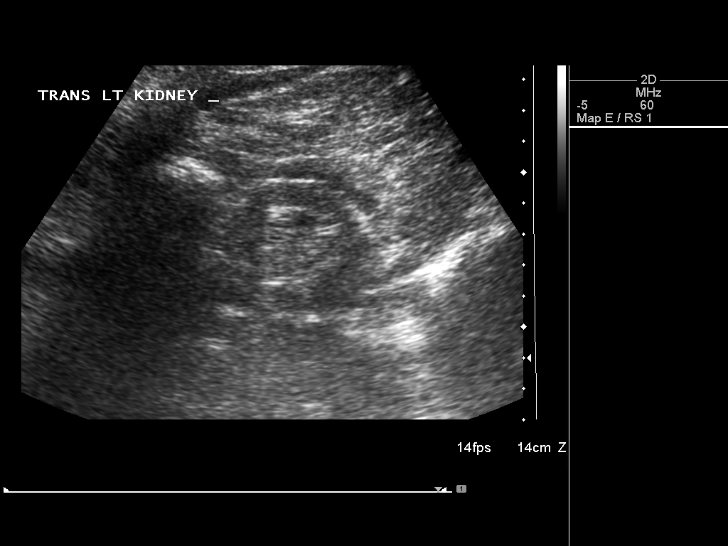
[im 32/48]
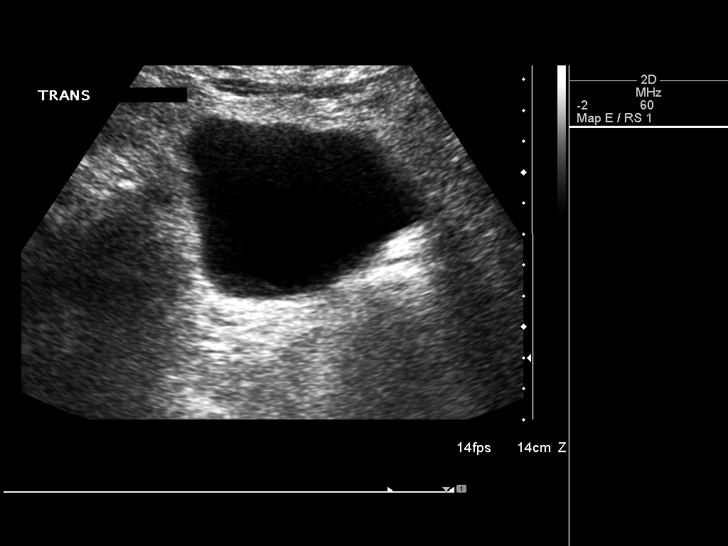
[im 36/48]
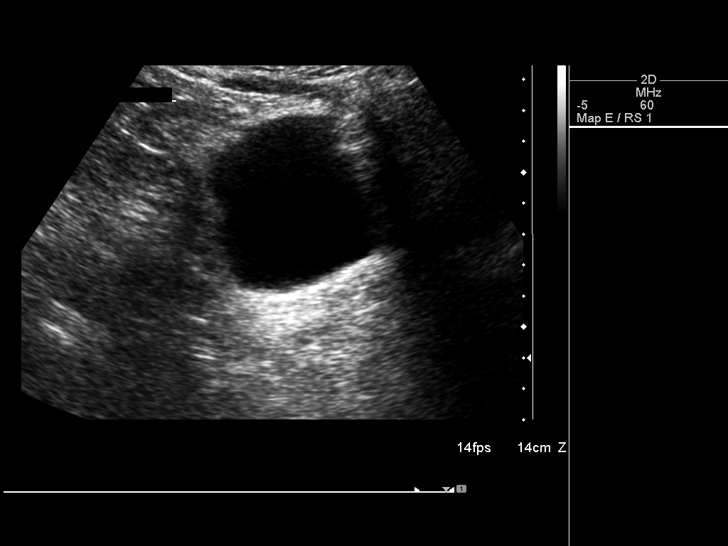
[im 40/48]
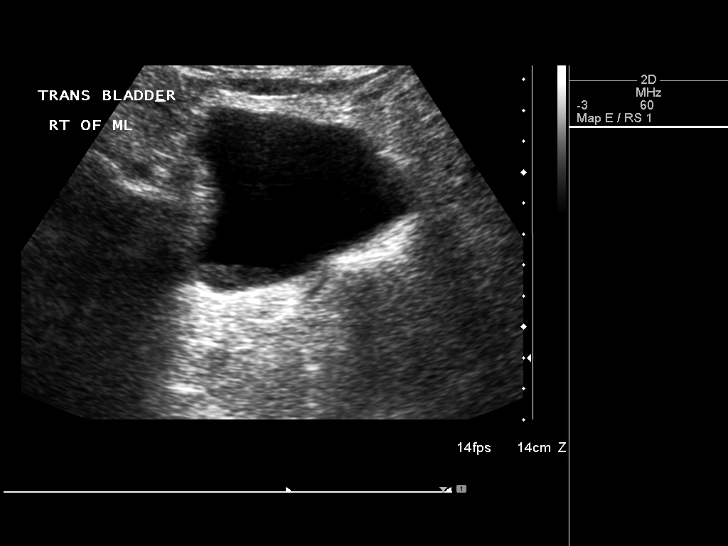
[im 44/48]
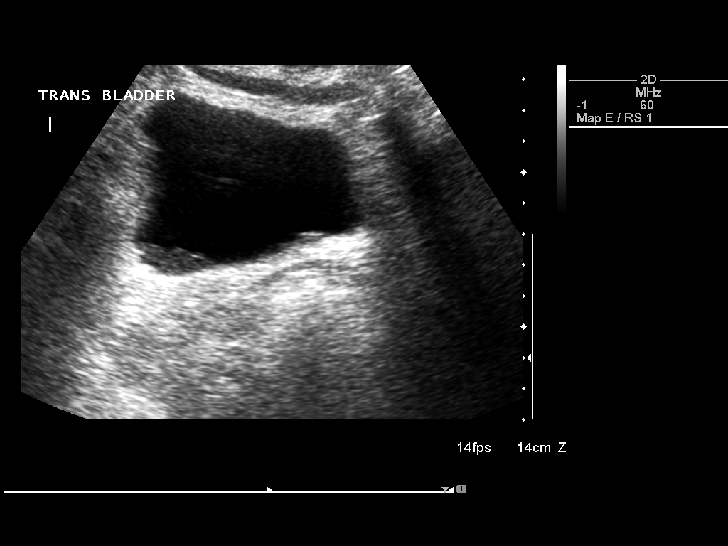
[im 48/48]
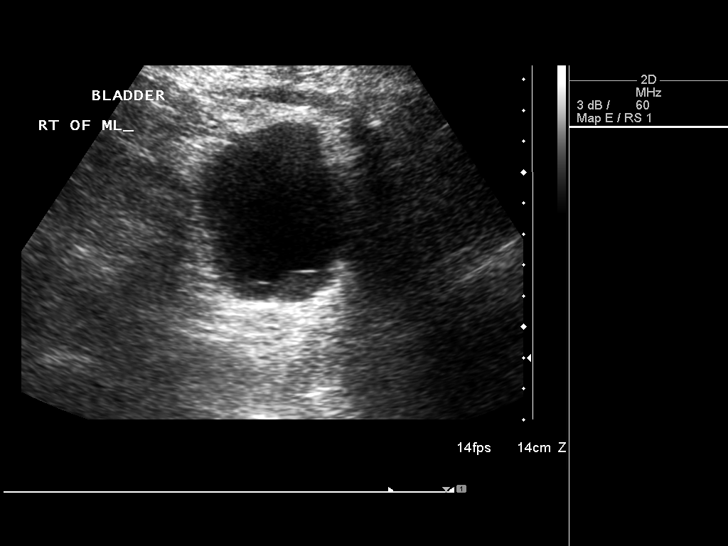

[14 of 25 positions shown; findings below may reference images not displayed]

FINDINGS: Right Kidney:

Length: Approximately 7.3 cm (previously approximately 8.7 cm).
Marked diffuse cortical thinning, progressive since the prior
examination. Echogenic parenchyma. No hydronephrosis. Approximate 9
mm exophytic simple cyst arising from the lower pole. No significant
focal parenchymal abnormality.

Left Kidney:

Length: Approximately 9.1 cm (previously approximately 11.5 cm).
Marked diffuse cortical thinning, especially the lower pole,
progressive since the prior examination. Echogenic parenchyma. No
hydronephrosis. No focal parenchymal abnormality.

Bladder:

Echogenic material layering along the dependent portion of the
bladder to the right of midline without internal color Doppler flow.
IMPRESSION: 1. Echogenic kidneys with marked diffuse cortical thinning
indicating chronic kidney disease.
2. No evidence of hydronephrosis involving either kidney.
3. No significant focal parenchymal abnormality involving either
kidney.
4. Echogenic debris involving the dependent portion of the urinary
bladder.

## 2017-09-06 IMAGING — CR DG WRIST COMPLETE 3+V*L*
4 series · 4 of 4 positions shown · non-contrast
Comparison: Left wrist radiograph performed 02/10/2015

CLINICAL DATA: Status post fall, with left wrist deformity. Initial
encounter.

EXAM:
LEFT WRIST - COMPLETE 3+ VIEW

[wrist pa]
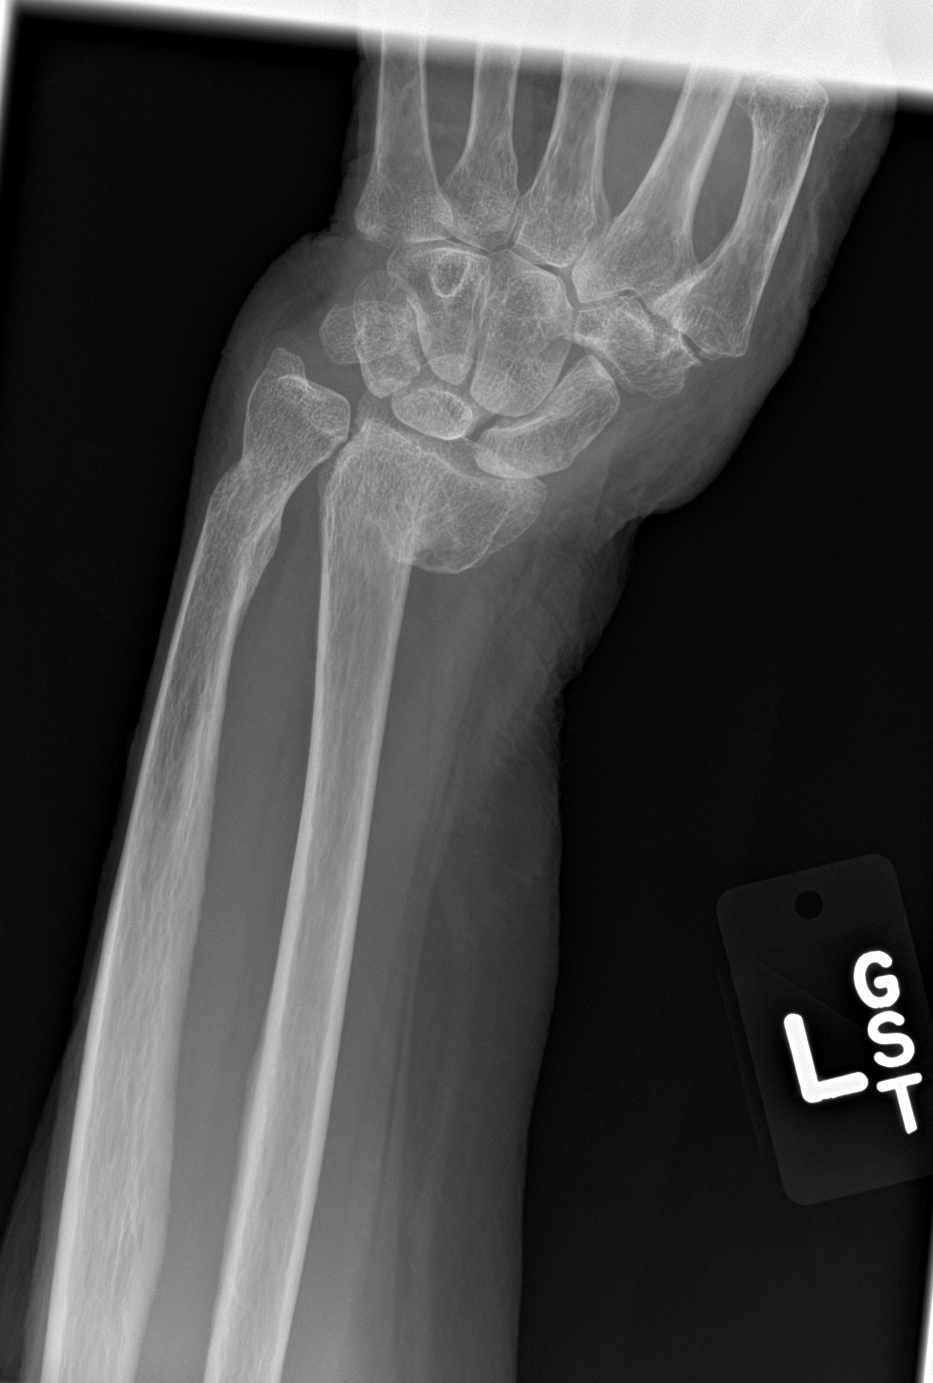

[wrist obl]
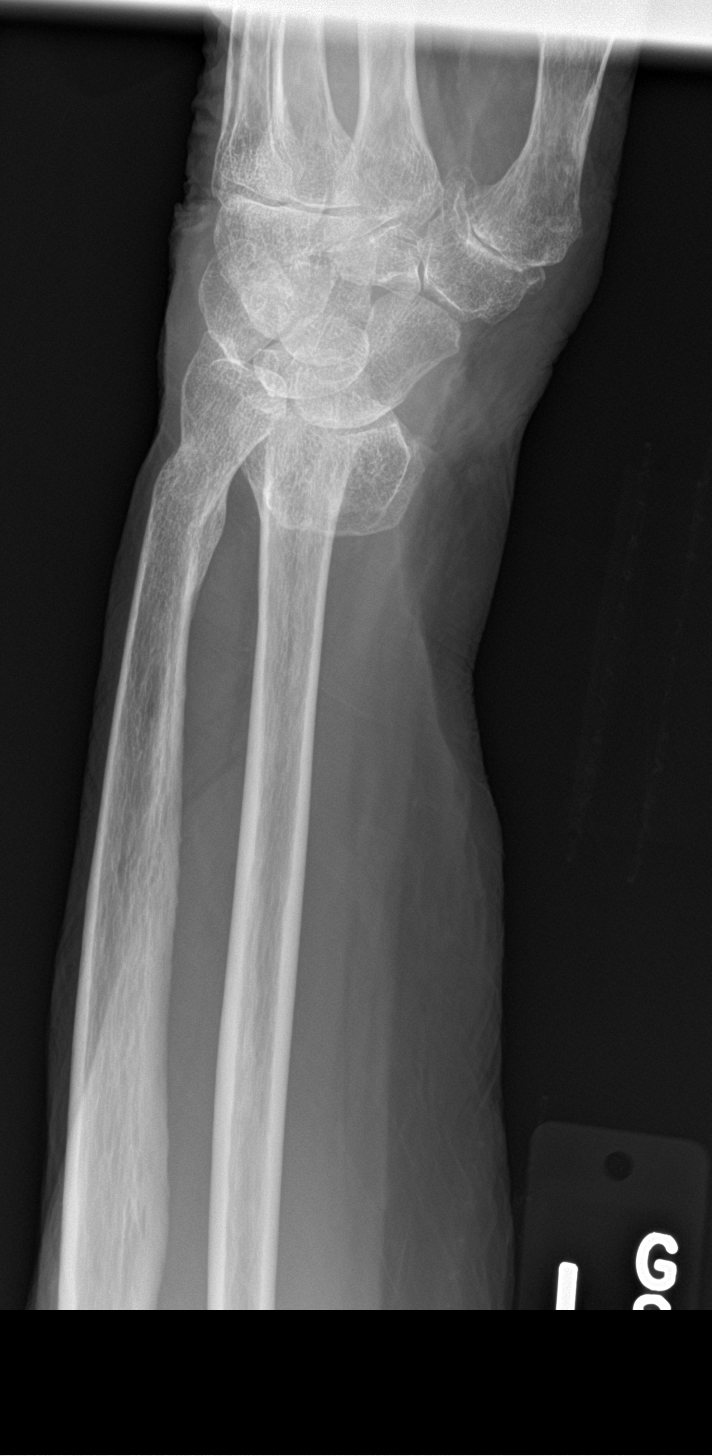

[wrist lat]
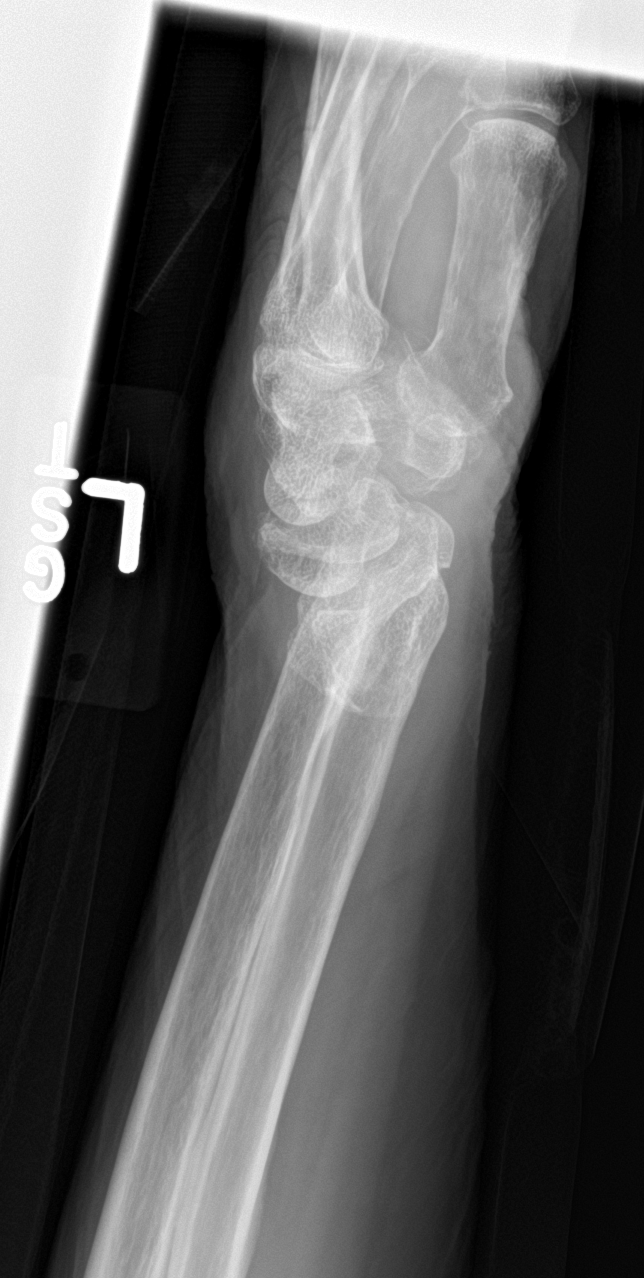

[wrist navicular]
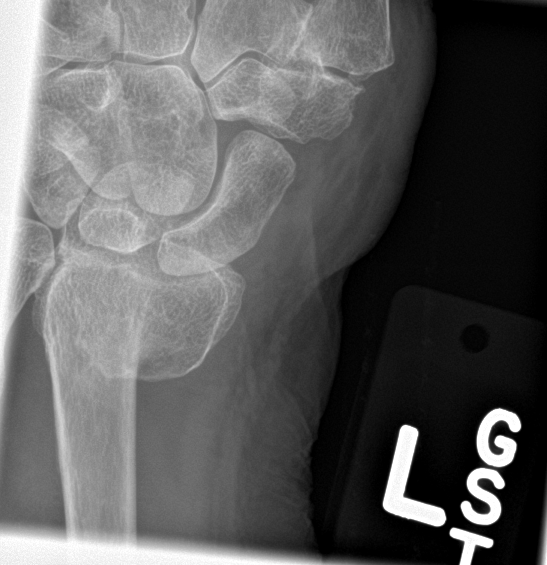

[4 of 4 positions shown; findings below may reference images not displayed]

FINDINGS: There is deformity of the distal radial metaphysis, with prominent
radial and dorsal displacement, and dorsal angulation. The carpal
rows articulate with the displaced distal radial metaphysis. Would
correlate clinically as to whether the patient has chronic wrist
deformity, as this fracture is in identical location to that seen in
1090, and no definite acute fracture line is characterized.

There is mild underlying chronic deformity of the distal ulnar
diaphysis. Soft tissue swelling is noted about the wrist. The carpal
rows are grossly unremarkable in appearance. Minimal degenerative
change is noted at the first carpometacarpal joint.
IMPRESSION: 1. Deformity of the distal radial metaphysis, with prominent radial
and dorsal displacement, and dorsal angulation. Would correlate
clinically as to whether the patient has chronic wrist deformity, as
this apparent fracture is in identical location to that seen in
1090, and no definite acute fracture line is characterized. The
patient's distal radius may have healed in significantly displaced
and angulated position, or this could reflect a poorly visualized
fracture line.
2. Underlying mild chronic deformity of the distal ulnar diaphysis.
3. Soft tissue swelling about the wrist.
These results were called by telephone at the time of interpretation
on 10/14/2015 at [DATE] to Dr. Nader Eberle, who verbally
acknowledged these results.

## 2020-02-28 ENCOUNTER — Other Ambulatory Visit: Payer: Self-pay | Admitting: Family Medicine

## 2020-02-28 NOTE — Progress Notes (Signed)
Opened in error
